# Patient Record
Sex: Female | Born: 1980 | Race: White | Hispanic: No | Marital: Married | Smoking: Never smoker
Health system: Southern US, Community
[De-identification: ages and names within clinical notes are randomized; demographics above are authoritative.]

## PROBLEM LIST (undated history)

## (undated) DIAGNOSIS — T7840XA Allergy, unspecified, initial encounter: Secondary | ICD-10-CM

## (undated) DIAGNOSIS — G8929 Other chronic pain: Secondary | ICD-10-CM

## (undated) DIAGNOSIS — F32A Depression, unspecified: Secondary | ICD-10-CM

## (undated) DIAGNOSIS — R51 Headache: Secondary | ICD-10-CM

## (undated) DIAGNOSIS — F329 Major depressive disorder, single episode, unspecified: Secondary | ICD-10-CM

## (undated) DIAGNOSIS — A09 Infectious gastroenteritis and colitis, unspecified: Secondary | ICD-10-CM

## (undated) DIAGNOSIS — K219 Gastro-esophageal reflux disease without esophagitis: Secondary | ICD-10-CM

## (undated) DIAGNOSIS — F909 Attention-deficit hyperactivity disorder, unspecified type: Secondary | ICD-10-CM

## (undated) DIAGNOSIS — Z8669 Personal history of other diseases of the nervous system and sense organs: Secondary | ICD-10-CM

## (undated) DIAGNOSIS — K859 Acute pancreatitis without necrosis or infection, unspecified: Secondary | ICD-10-CM

## (undated) DIAGNOSIS — R519 Headache, unspecified: Secondary | ICD-10-CM

## (undated) DIAGNOSIS — L509 Urticaria, unspecified: Secondary | ICD-10-CM

## (undated) HISTORY — DX: Headache, unspecified: R51.9

## (undated) HISTORY — DX: Attention-deficit hyperactivity disorder, unspecified type: F90.9

## (undated) HISTORY — DX: Headache: R51

## (undated) HISTORY — PX: UMBILICAL HERNIA REPAIR: SHX196

## (undated) HISTORY — DX: Urticaria, unspecified: L50.9

## (undated) HISTORY — PX: ADENOIDECTOMY: SUR15

## (undated) HISTORY — DX: Acute pancreatitis without necrosis or infection, unspecified: K85.90

## (undated) HISTORY — DX: Other chronic pain: G89.29

## (undated) HISTORY — DX: Allergy, unspecified, initial encounter: T78.40XA

## (undated) HISTORY — DX: Gastro-esophageal reflux disease without esophagitis: K21.9

## (undated) HISTORY — PX: TONSILLECTOMY AND ADENOIDECTOMY: SUR1326

## (undated) HISTORY — DX: Major depressive disorder, single episode, unspecified: F32.9

## (undated) HISTORY — DX: Infectious gastroenteritis and colitis, unspecified: A09

## (undated) HISTORY — PX: UPPER GASTROINTESTINAL ENDOSCOPY: SHX188

## (undated) HISTORY — DX: Depression, unspecified: F32.A

## (undated) HISTORY — DX: Personal history of other diseases of the nervous system and sense organs: Z86.69

## (undated) HISTORY — PX: TUBAL LIGATION: SHX77

---
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
## 2002-11-01 DIAGNOSIS — K859 Acute pancreatitis without necrosis or infection, unspecified: Secondary | ICD-10-CM

## 2002-11-01 HISTORY — DX: Acute pancreatitis without necrosis or infection, unspecified: K85.90

## 2004-09-09 ENCOUNTER — Ambulatory Visit: Payer: Self-pay | Admitting: Oncology

## 2009-07-31 ENCOUNTER — Encounter (INDEPENDENT_AMBULATORY_CARE_PROVIDER_SITE_OTHER): Payer: Self-pay | Admitting: Orthopedic Surgery

## 2009-07-31 ENCOUNTER — Ambulatory Visit (HOSPITAL_BASED_OUTPATIENT_CLINIC_OR_DEPARTMENT_OTHER): Admission: RE | Admit: 2009-07-31 | Discharge: 2009-07-31 | Payer: Self-pay | Admitting: Orthopedic Surgery

## 2011-02-05 LAB — POCT HEMOGLOBIN-HEMACUE: Hemoglobin: 13.9 g/dL (ref 12.0–15.0)
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
## 2013-11-06 ENCOUNTER — Ambulatory Visit (INDEPENDENT_AMBULATORY_CARE_PROVIDER_SITE_OTHER): Payer: No Typology Code available for payment source | Admitting: Family Medicine

## 2013-11-06 VITALS — BP 124/80 | HR 80 | Temp 99.0°F | Resp 17 | Ht 61.0 in | Wt 192.0 lb

## 2013-11-06 DIAGNOSIS — H539 Unspecified visual disturbance: Secondary | ICD-10-CM

## 2013-11-06 DIAGNOSIS — G43909 Migraine, unspecified, not intractable, without status migrainosus: Secondary | ICD-10-CM

## 2013-11-06 DIAGNOSIS — R112 Nausea with vomiting, unspecified: Secondary | ICD-10-CM

## 2013-11-06 MED ORDER — ONDANSETRON 4 MG PO TBDP: 4.0000 mg | ORAL_TABLET | Freq: Three times a day (TID) | ORAL | Status: DC | PRN

## 2013-11-06 MED ORDER — ELETRIPTAN HYDROBROMIDE 20 MG PO TABS: 20.0000 mg | ORAL_TABLET | ORAL | Status: DC | PRN
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
## 2013-11-06 NOTE — Patient Instructions (Signed)
Migraine Headache A migraine headache is an intense, throbbing pain on one or both sides of your head. A migraine can last for 30 minutes to several hours. CAUSES  The exact cause of a migraine headache is not always known. However, a migraine may be caused when nerves in the brain become irritated and release chemicals that cause inflammation. This causes pain. SYMPTOMS  Pain on one or both sides of your head.  Pulsating or throbbing pain.  Severe pain that prevents daily activities.  Pain that is aggravated by any physical activity.  Nausea, vomiting, or both.  Dizziness.  Pain with exposure to bright lights, loud noises, or activity.  General sensitivity to bright lights, loud noises, or smells. Before you get a migraine, you may get warning signs that a migraine is coming (aura). An aura may include:  Seeing flashing lights.  Seeing bright spots, halos, or zig-zag lines.  Having tunnel vision or blurred vision.  Having feelings of numbness or tingling.  Having trouble talking.  Having muscle weakness. MIGRAINE TRIGGERS  Alcohol.  Smoking.  Stress.  Menstruation.  Aged cheeses.  Foods or drinks that contain nitrates, glutamate, aspartame, or tyramine.  Lack of sleep.  Chocolate.  Caffeine.  Hunger.  Physical exertion.  Fatigue.  Medicines used to treat chest pain (nitroglycerine), birth control pills, estrogen, and some blood pressure medicines. DIAGNOSIS  A migraine headache is often diagnosed based on:  Symptoms.  Physical examination.  A CT scan or MRI of your head. TREATMENT Medicines may be given for pain and nausea. Medicines can also be given to help prevent recurrent migraines.  HOME CARE INSTRUCTIONS  Only take over-the-counter or prescription medicines for pain or discomfort as directed by your caregiver. The use of long-term narcotics is not recommended.  Lie down in a dark, quiet room when you have a migraine.  Keep a journal  to find out what may trigger your migraine headaches. For example, write down:  What you eat and drink.  How much sleep you get.  Any change to your diet or medicines.  Limit alcohol consumption.  Quit smoking if you smoke.  Get 7 to 9 hours of sleep, or as recommended by your caregiver.  Limit stress.  Keep lights dim if bright lights bother you and make your migraines worse. SEEK IMMEDIATE MEDICAL CARE IF:   Your migraine becomes severe.  You have a fever.  You have a stiff neck.  You have vision loss.  You have muscular weakness or loss of muscle control.  You start losing your balance or have trouble walking.  You feel faint or pass out.  You have severe symptoms that are different from your first symptoms. MAKE SURE YOU:   Understand these instructions.  Will watch your condition.  Will get help right away if you are not doing well or get worse. Document Released: 10/18/2005 Document Revised: 01/10/2012 Document Reviewed: 10/08/2011 Exit[employer] Patient Information 2014 [employer].  

## 2013-11-07 ENCOUNTER — Telehealth: Payer: Self-pay | Admitting: Radiology

## 2013-11-07 MED ORDER — SUMATRIPTAN SUCCINATE 50 MG PO TABS: 50.0000 mg | ORAL_TABLET | ORAL | Status: DC | PRN

## 2013-11-07 NOTE — Telephone Encounter (Signed)
Spoke with pt advised another Rx was sent in. Pt understood.

## 2013-11-07 NOTE — Telephone Encounter (Signed)
Patient has called the relpax is very expensive would [facility] something [facility] [place]

## 2013-11-07 NOTE — Telephone Encounter (Signed)
Sent sumatriptan (Imitrex) to pharmacy, hopefully this is less expensive

## 2017-03-08 ENCOUNTER — Encounter (HOSPITAL_COMMUNITY): Payer: Self-pay | Admitting: Emergency Medicine

## 2017-03-08 ENCOUNTER — Emergency Department (HOSPITAL_COMMUNITY)
Admission: EM | Admit: 2017-03-08 | Discharge: 2017-03-09 | Disposition: A | Discharge status: Home / Self Care | Payer: BLUE CROSS/BLUE SHIELD | Attending: Emergency Medicine | Admitting: Emergency Medicine

## 2017-03-08 DIAGNOSIS — T7840XA Allergy, unspecified, initial encounter: Secondary | ICD-10-CM | POA: Diagnosis not present

## 2017-03-08 DIAGNOSIS — L509 Urticaria, unspecified: Secondary | ICD-10-CM

## 2017-03-08 DIAGNOSIS — F909 Attention-deficit hyperactivity disorder, unspecified type: Secondary | ICD-10-CM | POA: Insufficient documentation

## 2017-03-08 NOTE — ED Provider Notes (Signed)
[facility]-EMERGENCY DEPT Provider Note   CSN: [id number] Arrival date & time: 03/08/17  2229     History   Chief Complaint Chief Complaint  Patient presents with  . Urticaria    HPI Alexa Horn is a 36 y.o. female.  HPI  36 y.o. female, presents to the Emergency Department today complaining of generalized itchy skin/hives with onset yesterday. Noted moderate relief of itching with OTC Benadryl. Noted rash with hives that are slowly resolving, but itching remains. No airway compromise. No shortness of breath. Notes no pain. States that she is unsure of allergen. No new medications. No new detergents, soaps. No seasonal allergies. No fevers. No other symptoms noted.   Past Medical History:  Diagnosis Date  . ADHD (attention deficit hyperactivity disorder)   . History of migraine headaches   . Pancreatitis    dx when she was pregnant, she gets flareups when she eats tomato and decongestant, was seen by Dr [place]ales [place]Gupta in [place]    There are no active problems to display for this patient.   Past Surgical History:  Procedure Laterality Date  . HERNIA REPAIR    . TONSILLECTOMY AND ADENOIDECTOMY    . TUBAL LIGATION    . UPPER GASTROINTESTINAL ENDOSCOPY      OB History    No data available       Home Medications    Prior to Admission medications   Medication Sig Start Date End Date Taking? Authorizing Provider  ondansetron (ZOFRAN ODT) 4 MG disintegrating tablet Take 1 tablet (4 mg total) by mouth every 8 (eight) hours as needed for nausea or vomiting. 11/06/13   Le, Thao P, DO  SUMAtriptan (IMITREX) 50 MG tablet Take 1 tablet (50 mg total) by mouth every 2 (two) hours as needed for migraine or headache. May repeat in 2 hours.  Max dose 200mg /day 11/07/13   Godfrey PickEgan, Eleanore E, PA-C    Family History No family history on file.  Social History Social History  Substance Use Topics  . Smoking status: Never Smoker  . Smokeless tobacco: Never Used  . Alcohol use No     Allergies     Ceftin [cefuroxime axetil]   Review of Systems Review of Systems  Constitutional: Negative for fever.  HENT: Negative for sinus pressure and trouble swallowing.   Respiratory: Negative for cough and shortness of breath.   Cardiovascular: Negative for chest pain.  Gastrointestinal: Negative for nausea.   Physical Exam Updated Vital Signs BP (!) 143/96 (BP Location: Left Arm)   Pulse 92   Temp 98.3 F (36.8 C) (Oral)   Resp 16   Ht 5' 1.5" (1.562 m)   Wt 94.3 kg   LMP 02/25/2017 (Approximate)   SpO2 98%   BMI 38.66 kg/m   Physical Exam  Constitutional: She is oriented to person, place, and time. Vital signs are normal. She appears well-developed and well-nourished.  HENT:  Head: Normocephalic and atraumatic.  Right Ear: Hearing normal.  Left Ear: Hearing normal.  Pt phonating well. No indication of airway compromise  Eyes: Conjunctivae and EOM are normal. Pupils are equal, round, and reactive to light.  Neck: Normal range of motion. Neck supple.  Cardiovascular: Normal rate, regular rhythm, normal heart sounds and intact distal pulses.   Pulmonary/Chest: Effort normal.  Abdominal: There is no tenderness.  Neurological: She is alert and oriented to person, place, and time.  Skin: Skin is warm and dry.  Diffuse urticarial rash with hives noted BUE/BLE as well as trunk.  No signs of infection.   Psychiatric: She has a normal mood and affect. Her speech is normal and behavior is normal. Thought content normal.  Nursing note and vitals reviewed.  ED Treatments / Results  Labs (all labs ordered are listed, but only abnormal results are displayed) Labs Reviewed - No data to display  EKG  EKG Interpretation None       Radiology No results found.  Procedures Procedures (including critical care time)  Medications Ordered in ED Medications - No data to display   Initial Impression / Assessment and Plan / ED Course  I have reviewed the triage vital signs and the  nursing notes.  Pertinent labs & imaging results that were available during my care of the patient were reviewed by me and considered in my medical decision making (see chart for details).  Final Clinical Impressions(s) / ED Diagnoses     {I have reviewed the relevant previous healthcare records.  {I obtained HPI from historian.   ED Course:  Assessment: Pt is a 36 y.o. female who presents with urticarial reaction. Unsure of source. No signs of airway compromise. Symptoms began yesterday. On exam, pt in NAD. Nontoxic/nonseptic appearing. VSS. Afebrile. Lungs CTA. Heart RRR. Abdomen nontender soft. Phonating well. NO signs of airway compromise. Given benadryl and steroids in ED. Plan is to DC home with steroids and follow up to PCP. At time of discharge, Patient is in no acute distress. Vital Signs are stable. Patient is able to ambulate. Patient able to tolerate PO.   Disposition/Plan:  DC Home Additional Verbal discharge instructions given and discussed with patient.  Pt Instructed to f/u with PCP in the next week for evaluation and treatment of symptoms. Return precautions given Pt acknowledges and agrees with plan  Supervising Physician Horton, Mayer Masker, MD  Final diagnoses:  Allergic reaction, initial encounter  Urticarial rash    New Prescriptions New Prescriptions   No medications on file     Audry Pili, Cordelia Poche 03/09/17 0040    Shon Baton, MD 03/09/17 [phone]

## 2017-03-08 NOTE — ED Triage Notes (Signed)
Patient reports generalized itchy skin hives/rashes onset yesterday unrelieved by OTC Benadryl , no oral swelling / respirations unlabored .

## 2017-03-09 MED ORDER — EPINEPHRINE 0.3 MG/0.3ML IJ SOAJ: 0.3000 mg | Freq: Once | INTRAMUSCULAR | 0 refills | Status: AC

## 2017-03-09 MED ORDER — PREDNISONE 20 MG PO TABS: 60.0000 mg | ORAL_TABLET | Freq: Every day | ORAL | 0 refills | Status: DC

## 2017-03-09 MED ORDER — DIPHENHYDRAMINE HCL 25 MG PO CAPS
25.0000 mg | ORAL_CAPSULE | Freq: Once | ORAL | Status: AC
  Administered 2017-03-09: 25 mg via ORAL
  Filled 2017-03-09: qty 1

## 2017-03-09 MED ORDER — DIPHENHYDRAMINE HCL 25 MG PO CAPS: 25.0000 mg | ORAL_CAPSULE | Freq: Four times a day (QID) | ORAL | 0 refills | Status: AC | PRN

## 2017-03-09 MED ORDER — FAMOTIDINE 20 MG PO TABS
20.0000 mg | ORAL_TABLET | Freq: Once | ORAL | Status: AC
  Administered 2017-03-09: 20 mg via ORAL
  Filled 2017-03-09: qty 1

## 2017-03-09 MED ORDER — PREDNISONE 20 MG PO TABS
60.0000 mg | ORAL_TABLET | Freq: Once | ORAL | Status: AC
  Administered 2017-03-09: 60 mg via ORAL
  Filled 2017-03-09: qty 3

## 2017-03-09 NOTE — Discharge Instructions (Signed)
Please read and follow all provided instructions.  Your diagnoses today include:  1. Allergic reaction, initial encounter   2. Urticarial rash     Tests performed today include: Vital signs. See below for your results today.   Medications prescribed:  Take as prescribed   Home care instructions:  Follow any educational materials contained in this packet.  Follow-up instructions: Please follow-up with an allergist for further evaluation of symptoms and treatment   Return instructions:  Please return to the Emergency Department if you do not get better, if you get worse, or new symptoms OR  - Fever (temperature greater than 101.51F)  - Bleeding that does not stop with holding pressure to the area    -Severe pain (please note that you may be more sore the day after your accident)  - Chest Pain  - Difficulty breathing  - Severe nausea or vomiting  - Inability to tolerate food and liquids  - Passing out  - Skin becoming red around your wounds  - Change in mental status (confusion or lethargy)  - New numbness or weakness    Please return if you have any other emergent concerns.  Additional Information:  Your vital signs today were: BP (!) 143/96 (BP Location: Left Arm)    Pulse 92    Temp 98.3 F (36.8 C) (Oral)    Resp 16    Ht 5' 1.5" (1.562 m)    Wt 94.3 kg    LMP 02/25/2017 (Approximate)    SpO2 98%    BMI 38.66 kg/m  If your blood pressure (BP) was elevated above 135/85 this visit, please have this repeated by your doctor within one month. ---------------

## 2017-03-11 ENCOUNTER — Emergency Department (HOSPITAL_COMMUNITY): Payer: BLUE CROSS/BLUE SHIELD

## 2017-03-11 ENCOUNTER — Encounter (HOSPITAL_COMMUNITY): Payer: Self-pay | Admitting: *Deleted

## 2017-03-11 ENCOUNTER — Emergency Department (HOSPITAL_COMMUNITY)
Admission: EM | Admit: 2017-03-11 | Discharge: 2017-03-12 | Disposition: A | Discharge status: Home / Self Care | Payer: BLUE CROSS/BLUE SHIELD | Attending: Emergency Medicine | Admitting: Emergency Medicine

## 2017-03-11 DIAGNOSIS — Z7982 Long term (current) use of aspirin: Secondary | ICD-10-CM | POA: Diagnosis not present

## 2017-03-11 DIAGNOSIS — L509 Urticaria, unspecified: Secondary | ICD-10-CM | POA: Diagnosis not present

## 2017-03-11 DIAGNOSIS — F909 Attention-deficit hyperactivity disorder, unspecified type: Secondary | ICD-10-CM | POA: Insufficient documentation

## 2017-03-11 DIAGNOSIS — R519 Headache, unspecified: Secondary | ICD-10-CM

## 2017-03-11 DIAGNOSIS — L299 Pruritus, unspecified: Secondary | ICD-10-CM | POA: Diagnosis present

## 2017-03-11 DIAGNOSIS — H5500 Unspecified nystagmus: Secondary | ICD-10-CM | POA: Diagnosis not present

## 2017-03-11 DIAGNOSIS — R51 Headache: Secondary | ICD-10-CM | POA: Diagnosis not present

## 2017-03-11 DIAGNOSIS — R42 Dizziness and giddiness: Secondary | ICD-10-CM | POA: Diagnosis not present

## 2017-03-11 LAB — COMPREHENSIVE METABOLIC PANEL
ALT: 13 U/L — ABNORMAL LOW (ref 14–54)
AST: 19 U/L (ref 15–41)
Albumin: 3.9 g/dL (ref 3.5–5.0)
Alkaline Phosphatase: 47 U/L (ref 38–126)
Anion gap: 9 (ref 5–15)
BUN: 10 mg/dL (ref 6–20)
CO2: 21 mmol/L — ABNORMAL LOW (ref 22–32)
Calcium: 8.1 mg/dL — ABNORMAL LOW (ref 8.9–10.3)
Chloride: 108 mmol/L (ref 101–111)
Creatinine, Ser: 0.83 mg/dL (ref 0.44–1.00)
GFR calc Af Amer: >60 mL/min (ref >60)
GFR calc non Af Amer: >60 mL/min (ref >60)
Glucose, Bld: 135 mg/dL — ABNORMAL HIGH (ref 65–99)
Potassium: 3.9 mmol/L (ref 3.5–5.1)
Sodium: 138 mmol/L (ref 135–145)
Total Bilirubin: 0.3 mg/dL (ref 0.3–1.2)
Total Protein: 6.9 g/dL (ref 6.5–8.1)

## 2017-03-11 LAB — CBC WITH DIFFERENTIAL/PLATELET
Basophils Absolute: 0 10*3/uL (ref 0.0–0.1)
Basophils Relative: 0 %
Eosinophils Absolute: 0 10*3/uL (ref 0.0–0.7)
Eosinophils Relative: 0 %
HCT: 37.9 % (ref 36.0–46.0)
Hemoglobin: 12.7 g/dL (ref 12.0–15.0)
Lymphocytes Relative: 12 %
Lymphs Abs: 1.4 10*3/uL (ref 0.7–4.0)
MCH: 30.2 pg (ref 26.0–34.0)
MCHC: 33.5 g/dL (ref 30.0–36.0)
MCV: 90 fL (ref 78.0–100.0)
Monocytes Absolute: 0.1 10*3/uL (ref 0.1–1.0)
Monocytes Relative: 1 %
Neutro Abs: 10 10*3/uL — ABNORMAL HIGH (ref 1.7–7.7)
Neutrophils Relative %: 87 %
Platelets: 251 10*3/uL (ref 150–400)
RBC: 4.21 MIL/uL (ref 3.87–5.11)
RDW: 12.8 % (ref 11.5–15.5)
WBC: 11.5 10*3/uL — ABNORMAL HIGH (ref 4.0–10.5)

## 2017-03-11 LAB — URINALYSIS, ROUTINE W REFLEX MICROSCOPIC
Bilirubin Urine: NEGATIVE
Glucose, UA: NEGATIVE mg/dL
Hgb urine dipstick: NEGATIVE
Ketones, ur: NEGATIVE mg/dL
Nitrite: NEGATIVE
Protein, ur: NEGATIVE mg/dL
Specific Gravity, Urine: 1.012 (ref 1.005–1.030)
pH: 5 (ref 5.0–8.0)

## 2017-03-11 LAB — MAGNESIUM: Magnesium: 1.7 mg/dL (ref 1.7–2.4)

## 2017-03-11 LAB — PREGNANCY, URINE: Preg Test, Ur: NEGATIVE

## 2017-03-11 MED ORDER — FAMOTIDINE IN NACL 20-0.9 MG/50ML-% IV SOLN
20.0000 mg | Freq: Once | INTRAVENOUS | Status: AC
  Administered 2017-03-11: 20 mg via INTRAVENOUS
  Filled 2017-03-11: qty 50

## 2017-03-11 MED ORDER — ONDANSETRON HCL 4 MG/2ML IJ SOLN
4.0000 mg | Freq: Once | INTRAMUSCULAR | Status: AC
  Administered 2017-03-11: 4 mg via INTRAVENOUS
  Filled 2017-03-11: qty 2

## 2017-03-11 MED ORDER — DIPHENHYDRAMINE HCL 50 MG/ML IJ SOLN
25.0000 mg | Freq: Once | INTRAMUSCULAR | Status: AC
  Administered 2017-03-11: 25 mg via INTRAVENOUS
  Filled 2017-03-11: qty 1

## 2017-03-11 MED ORDER — SODIUM CHLORIDE 0.9 % IV BOLUS (SEPSIS)
1000.0000 mL | Freq: Once | INTRAVENOUS | Status: AC
  Administered 2017-03-11: 1000 mL via INTRAVENOUS

## 2017-03-11 MED ORDER — KETOROLAC TROMETHAMINE 30 MG/ML IJ SOLN
30.0000 mg | Freq: Once | INTRAMUSCULAR | Status: AC
  Administered 2017-03-11: 30 mg via INTRAVENOUS
  Filled 2017-03-11: qty 1

## 2017-03-11 NOTE — ED Notes (Signed)
Neurologist at bedside. 

## 2017-03-11 NOTE — Consult Note (Signed)
Neurology Consultation Reason for Consult: Dizziness Referring Physician: Tegeler, C  CC: Urticaria  History is obtained from: Patient  HPI: Alexa Horn is a 36 y.o. female who has been having recurrent episodes of urticaria past couple of weeks. She had a flare today and went to urgent care where she was given at the, steroids and fluids. She was there around 20 minutes. She then a couple of hours later felt her symptoms return and went to fire station where she was transported to the ER via EMS.  She notes dizziness that has been present with this episode. She also describes spots in her vision which have come and gone. She has photophobia and mild nausea. She has some discomfort on the right side of her head.  Of note, she does get vertiginous migraine as does her mother. She also notes that her eyes feel funny, and she has noticed this for a couple of months.    ROS: A 14 point ROS was performed and is negative except as noted in the HPI.   Past Medical History:  Diagnosis Date  . ADHD (attention deficit hyperactivity disorder)   . History of migraine headaches   . Pancreatitis    dx when she was pregnant, she gets flareups when she eats tomato and decongestant, was seen by Dr Chales AbrahamsGupta in [place]     Mother-vertiginous migraine  Social History:  reports that she has never smoked. She has never used smokeless tobacco. She reports that she does not drink alcohol or use drugs.   Exam: Current vital signs: BP (!) 144/95 (BP Location: Left Arm)   Pulse 87   Temp 97.8 F (36.6 C)   Resp 20   LMP 02/25/2017 (Approximate)   SpO2 100%  Vital signs in last 24 hours: Temp:  [97.8 F (36.6 C)] 97.8 F (36.6 C) (05/11 1552) Pulse Rate:  [81-108] 87 (05/11 2106) Resp:  [20-22] 20 (05/11 2106) BP: (130-144)/(72-95) 144/95 (05/11 2106) SpO2:  [98 %-100 %] 100 % (05/11 2106)   Physical Exam  Constitutional: Appears well-developed and well-nourished.  Psych: Affect appropriate  to situation Eyes: No scleral injection HENT: No OP obstrucion Head: Normocephalic.  Cardiovascular: Normal rate and regular rhythm.  Respiratory: Effort normal and breath sounds normal to anterior ascultation GI: Soft.  No distension. There is no tenderness.  Skin: WDI  Neuro: Mental Status: Patient is awake, alert, oriented to person, place, month, year, and situation. Patient is able to give a clear and coherent history. No signs of aphasia or neglect Cranial Nerves: II: Visual Fields are full. Pupils are equal, round, and reactive to light.   III,IV, VI: EOMI without ptosis or diploplia. She has abnormal saccadic intrusion into smooth pursuit, though when testing finger-nose-finger I move my finger repeatedly and she follows without clear saccadic intrusion. V: Facial sensation is symmetric to temperature VII: Facial movement is symmetric.  VIII: hearing is intact to voice X: Uvula elevates symmetrically XI: Shoulder shrug is symmetric. XII: tongue is midline without atrophy or fasciculations.  Motor: Tone is normal. Bulk is normal. 5/5 strength was present in all four extremities.  Sensory: Sensation is symmetric to light touch and temperature in the arms and legs. Deep Tendon Reflexes: 2+ and symmetric in the biceps and patellae.  Plantars: Toes are downgoing bilaterally.  Cerebellar: FNF and HKS are intact bilaterally      I have reviewed labs in epic and the results pertinent to this consultation are: CMP-unremarkable  I have reviewed the  images obtained: CT head-unremarkable  Impression: 36 year old female with dizziness, lightheadedness, photophobia, head discomfort in the setting of previous migraines with similar symptoms. I suspect that due to physiological stress with whatever is happening with the recurrent urticaria, she has developed a competent migraine.  Given these other symptoms, however, I do think that MRI is reasonable and would favor getting this  prior to discharge. If this is negative, then neurology would have no further recommendations other than consideration of Toradol for migraine.  Recommendations: 1) MRI brain 2) if negative could consider Toradol for migraine    Ritta Slot, MD [facility] [phone]  If 7pm- 7am, please page neurology on call as listed in [facility].

## 2017-03-11 NOTE — ED Notes (Signed)
Pt transported to MRI 

## 2017-03-11 NOTE — ED Provider Notes (Signed)
[facility]-EMERGENCY DEPT Provider Note   CSN: [id number] Arrival date & time: 03/11/17  1531     History   Chief Complaint Chief Complaint  Patient presents with  . Allergic Reaction    HPI Alexa Horn is a 36 y.o. female.  The history is provided by the patient and medical records. No language interpreter was used.     Alexa Horn is a 36 y.o. female  with a PMH of migraines, ADHD who presents to the Emergency Department complaining of "allergic reaction" which occurred today.   Patient states that Tuesday night (5/08) she developed generalized itching with redness to her bilateral arms, neck, back and cheeks. She went to ER where she was given a few medications (benadryl, pepcid, prednisone) and felt better. No known trigger. She was taking her home prednisone daily, but forgot to take it this morning. Today, she was eating lunch when she felt her lips start to tingle and face felt flushed. Friend she was eating lunch with told her that her face was very red. She then felt as if she was having difficulty swallowing. She took a 25mg  benadryl, then went to the nearest urgent care where she states she was given epi, a steroid shot and a bag of fluids. She was there around 20 minutes, felt better and was discharged to home. Left urgent care around 12:45. Around 2 - 2:30 she felt like her symptoms started coming back. She again felt facial flushing and as if it was more difficult to swallow. She drove to the closest place she could think of which was the fire station. EMS then transported patient to ER. She has had no medication since seen at urgent care. Has not had any fevers/chills or recent illnesses. No shortness of breath or chest pain. No wheezing. No similar foods, lotions, detergents or other known triggers similar to episode on 5/08. She additionally notes intermittent headaches and her eyes "dancing around like they can't focus" off and on for 2-3 months. Denies blurry vision, numbness or  tingling.  Past Medical History:  Diagnosis Date  . ADHD (attention deficit hyperactivity disorder)   . History of migraine headaches   . Pancreatitis    dx when she was pregnant, she gets flareups when she eats tomato and decongestant, was seen by Dr Chales Abrahams in [place]    There are no active problems to display for this patient.   Past Surgical History:  Procedure Laterality Date  . HERNIA REPAIR    . TONSILLECTOMY AND ADENOIDECTOMY    . TUBAL LIGATION    . UPPER GASTROINTESTINAL ENDOSCOPY      OB History    No data available       Home Medications    Prior to Admission medications   Medication Sig Start Date End Date Taking? Authorizing Provider  aspirin-acetaminophen-caffeine (EXCEDRIN MIGRAINE) [phone] MG tablet Take 2 tablets by mouth every 6 (six) hours as needed for headache.   Yes [provider]  CONTRAVE 8-90 MG TB12 Take 2 tablets by mouth 2 (two) times daily.  02/11/17  Yes [provider]  diphenhydrAMINE (BENADRYL) 25 mg capsule Take 1 capsule (25 mg total) by mouth every 6 (six) hours as needed. 03/09/17  Yes Audry Pili, PA-C  predniSONE (DELTASONE) 20 MG tablet Take 2 tablets (40 mg total) by mouth total until Wednesday 5/16. 03/12/17   Ward, Chase Picket, PA-C  SUMAtriptan (IMITREX) 50 MG tablet Take 1 tablet (50 mg total) by mouth every 2 (two)  hours as needed for migraine or headache. May repeat in 2 hours.  Max dose 200mg /day Patient not taking: Reported on 03/11/2017 11/07/13   Godfrey Pick, PA-C    Family History No family history on file.  Social History Social History  Substance Use Topics  . Smoking status: Never Smoker  . Smokeless tobacco: Never Used  . Alcohol use No     Allergies   Ceftin [cefuroxime axetil]; Lactose intolerance (gi); and Orange fruit [citrus]   Review of Systems Review of Systems  HENT: Positive for trouble swallowing.   Skin: Positive for color change.  Neurological:       + tingling  All  other systems reviewed and are negative.    Physical Exam Updated Vital Signs BP 117/82 (BP Location: Left Arm)   Pulse 83   Temp 97.8 F (36.6 C)   Resp 18   LMP 02/25/2017 (Approximate)   SpO2 98%   Physical Exam  Constitutional: She is oriented to person, place, and time. She appears well-developed and well-nourished. No distress.  HENT:  Head: Normocephalic and atraumatic.  Airway patent. No oral or lip swelling.  Eyes:  Bilateral nystagmus with accommodation.  Cardiovascular: Normal rate, regular rhythm and normal heart sounds.   No murmur heard. Pulmonary/Chest: Effort normal and breath sounds normal. No respiratory distress.  Lungs clear to ausculation bilaterally. No wheezing. Speaking in full sentences without difficulty. No hoarseness.  Abdominal: Soft. She exhibits no distension. There is no tenderness.  Musculoskeletal: Normal range of motion.  Neurological: She is alert and oriented to person, place, and time.  Skin: Skin is warm and dry.  No rash noted on exam, however patient does have photos from earlier today prior to medications showing redness to checks, chin and chest that seem c/w urticaria.  Nursing note and vitals reviewed.    ED Treatments / Results  Labs (all labs ordered are listed, but only abnormal results are displayed) Labs Reviewed  COMPREHENSIVE METABOLIC PANEL - Abnormal; Notable for the following:       Result Value   CO2 21 (*)    Glucose, Bld 135 (*)    Calcium 8.1 (*)    ALT 13 (*)    All other components within normal limits  CBC WITH DIFFERENTIAL/PLATELET - Abnormal; Notable for the following:    WBC 11.5 (*)    Neutro Abs 10.0 (*)    All other components within normal limits  URINALYSIS, ROUTINE W REFLEX MICROSCOPIC - Abnormal; Notable for the following:    APPearance HAZY (*)    Leukocytes, UA SMALL (*)    Bacteria, UA RARE (*)    Squamous Epithelial / LPF 6-30 (*)    All other components within normal limits  PREGNANCY,  URINE  MAGNESIUM    EKG  EKG Interpretation None       Radiology Ct Head Wo Contrast  Result Date: 03/11/2017 CLINICAL DATA:  Headache.  Allergic reaction. EXAM: CT HEAD WITHOUT CONTRAST TECHNIQUE: Contiguous axial images were obtained from the base of the skull through the vertex without intravenous contrast. COMPARISON:  07/11/2013 FINDINGS: Brain: Normal. No evidence of acute infarction, hemorrhage, hydrocephalus, extra-axial collection or mass lesion/mass effect. Vascular: No hyperdense vessel or unexpected calcification. Skull: Normal. Negative for fracture or focal lesion. Sinuses/Orbits: Negative IMPRESSION: Negative head CT. Electronically Signed   By: Marnee Spring M.D.   On: 03/11/2017 19:55   Mr Brain Wo Contrast  Result Date: 03/11/2017 CLINICAL DATA:  36 y/o F; dizziness, vision  changes, and nausea. History of migraine headache. EXAM: MRI HEAD WITHOUT CONTRAST TECHNIQUE: Multiplanar, multiecho pulse sequences of the brain and surrounding structures were obtained without intravenous contrast. COMPARISON:  03/11/2017 CT of the head. FINDINGS: Brain: No acute infarction, hemorrhage, hydrocephalus, extra-axial collection or mass lesion. Vascular: Normal flow voids. Skull and upper cervical spine: Normal marrow signal. Sinuses/Orbits: Negative. Other: None. IMPRESSION: Normal MRI of the brain. Electronically Signed   By: Mitzi HansenLance  Furusawa-Stratton M.D.   On: 03/11/2017 23:06    Procedures Procedures (including critical care time)  Medications Ordered in ED Medications  famotidine (PEPCID) IVPB 20 mg premix (0 mg Intravenous Stopped 03/11/17 1715)  diphenhydrAMINE (BENADRYL) injection 25 mg (25 mg Intravenous Given 03/11/17 1645)  sodium chloride 0.9 % bolus 1,000 mL (0 mLs Intravenous Stopped 03/11/17 1846)  ondansetron (ZOFRAN) injection 4 mg (4 mg Intravenous Given 03/11/17 1844)  sodium chloride 0.9 % bolus 1,000 mL (0 mLs Intravenous Stopped 03/11/17 2333)  ketorolac (TORADOL)  30 MG/ML injection 30 mg (30 mg Intravenous Given 03/11/17 2333)     Initial Impression / Assessment and Plan / ED Course  I have reviewed the triage vital signs and the nursing notes.  Pertinent labs & imaging results that were available during my care of the patient were reviewed by me and considered in my medical decision making (see chart for details).    Hale Dronennie B Schild is a 36 y.o. female who presents to ED for recurrent urticaria associated with lip tingling and dysphagia thought to be 2/2 allergic reaction. Seen in ED on 5/09 for same: chart reviewed from this encounter. She did not take prednisone this morning. Unsure of trigger. On exam, patient with patent airway, no signs of angioedema or oral swelling and clear lungs bilaterally. Seen by urgent care prior to arrival and given Epi and steroids. Benadryl and pepcid given here.   Patient also endorses intermittent headaches associated with nausea, photophobia and dizziness. Very severe bilateral nystagmus on exam. CT head negative. Neurology consulted who came to ED to evaluation patient. Appreciate their assistance with patient care today. Per neurology, obtain MRI brain. If negative, treat migraine and have patient follow up with neurology.   MRI also negative. Labs and urine reassuring. Patient has been in ED > 8 hours with no return of rash or signs of airway compromise. Repeat evaluation reassuring with clear lungs and speaking in full sentences without difficulty. Patient feels comfortable with discharge to home. Evaluation does not show pathology that would require ongoing emergent intervention or inpatient treatment however I spoke with patient and family AT LENGTH about reasons to return to the ER including difficulty breathing or swallowing, oral/lip swelling, recurrent rash not improving with benadryl, new or worsening symptoms, any other concerning symptoms. Patient voiced understanding and agreement with plan as dictated above and  understands to keep scheduled appointment with allergist on Wednesday. 40mg  prednisone daily until this appointment (5 day burst). Also understands to follow up with neurology - referral information given. All questions answered.   Patient seen by and discussed with Dr. Rush Landmarkegeler who agrees with treatment plan.   Final Clinical Impressions(s) / ED Diagnoses   Final diagnoses:  Urticaria  Nonintractable headache, unspecified chronicity pattern, unspecified headache type  Nystagmus    New Prescriptions Current Discharge Medication List       Ward, Chase PicketJaime Pilcher, PA-C 03/12/17 [id number]    Tegeler, Canary Brimhristopher J, MD 03/12/17 [id number]

## 2017-03-11 NOTE — ED Triage Notes (Signed)
BIB EMS with "allergic reaction" [facility] and today for same allergic reaction.

## 2017-03-11 NOTE — ED Notes (Signed)
Patient transported to CT 

## 2017-03-11 NOTE — ED Notes (Signed)
PT state while standing for vitals felt legs were going to give out

## 2017-03-12 MED ORDER — PREDNISONE 20 MG PO TABS: ORAL_TABLET | ORAL | 0 refills | Status: DC

## 2017-03-12 NOTE — Discharge Instructions (Signed)
It was my pleasure taking care of you today!   Thankfully your workup today was very reassuring with a negative CT and MRI of your head.  I would like you to call the neurology clinic listed on Monday morning to schedule a follow-up appointment for further discussion of today's emergency department visit.  It is very important that you keep your scheduled appointment with the allergist on Wednesday. As we discussed, continue taking prednisone daily until this appointment. Continue taking Benadryl as needed.  Please return to the ER if you develop any difficulty breathing, lip or oral swelling, difficulty swallowing, new or worsening symptoms, any additional concerns.

## 2017-04-01 ENCOUNTER — Ambulatory Visit: Payer: No Typology Code available for payment source | Admitting: Allergy & Immunology

## 2017-04-18 ENCOUNTER — Ambulatory Visit: Payer: No Typology Code available for payment source | Admitting: Allergy and Immunology

## 2017-04-21 ENCOUNTER — Ambulatory Visit (INDEPENDENT_AMBULATORY_CARE_PROVIDER_SITE_OTHER): Payer: BLUE CROSS/BLUE SHIELD | Admitting: Allergy and Immunology

## 2017-04-21 ENCOUNTER — Encounter: Payer: Self-pay | Admitting: Allergy and Immunology

## 2017-04-21 VITALS — BP 110/78 | HR 64 | Temp 98.1°F | Resp 16 | Ht 61.0 in | Wt 206.0 lb

## 2017-04-21 DIAGNOSIS — T7800XD Anaphylactic reaction due to unspecified food, subsequent encounter: Secondary | ICD-10-CM

## 2017-04-21 NOTE — Progress Notes (Signed)
Dear Dr. Mathis BudUppin,  Thank you for referring Alexa DroneAnnie B Bacallao to the [facility] on 04/21/2017.   Below is a summation of this patient's evaluation and recommendations.  Thank you for your referral. I will keep you informed about this patient's response to treatment.   If you have any questions please do not hesitate to contact me.   Sincerely,  Jessica PriestEric J. Kozlow, MD Allergy / Immunology [facility]   ______________________________________________________________________    NEW PATIENT NOTE  Referring Provider: Lucianne LeiUppin, Nina, MD Primary Provider: Lucianne LeiUppin, Nina, MD Date of office visit: 04/21/2017    Subjective:   Chief Complaint:  Alexa Horn (DOB: 11/26/1980) is a 36 y.o. female who presents to the clinic on 04/21/2017 with a chief complaint of Allergic Reaction (Dx with Alpha Gal and other food allergies) .     HPI: Pattricia Horn presents to this clinic in evaluation of allergic reaction that occurred approximately one month ago. Apparently she had a one-week period of recurrent episodes of red raised itchy lesions across her face and body with mouth and chin swelling and difficulty breathing requiring her to go to the urgent care and emergency room on at least 2 occasions and received two systemic steroids as well as other therapy. There was no other associated systemic or constitutional symptoms.  There was not an obvious provoking factor giving rise to this immunological hyperreactivity. Apparently she did see a allergist in [facility] who did blood testing which identified milk and wheat and dog hypersensitivity. She also saw her primary care doctor who performed a blood test for alpha gal which reportedly is positive. Since she stopped all mammal consumption all hyperreactivity has stopped. She did have exposure to a tick on a pretty common basis and in fact had a significant embedded tick about 3 weeks  prior to her reaction. She does have a history of early childhood Orange reaction which apparently precipitates a yeast infection. She will get itchy eyes and nasal congestion and sneezing if she gets exposure to cats. But otherwise really has no other significant atopic disease.  Past Medical History:  Diagnosis Date  . ADHD (attention deficit hyperactivity disorder)   . History of migraine headaches   . Pancreatitis    dx when she was pregnant, she gets flareups when she eats tomato and decongestant, was seen by [place]Gu[place] in [place]  . Urticaria     Past Surgical History:  Procedure Laterality Date  . ADENOIDECTOMY    . HERNIA REPAIR    . TONSILLECTOMY AND ADENOIDECTOMY    . TUBAL LIGATION    . UPPER GASTROINTESTINAL ENDOSCOPY      Allergies as of 04/21/2017      Reactions   Ceftin [cefuroxime Axetil] Hives   Lactose Intolerance (gi)    Orange Fruit [citrus]    Advised caused yeast infections when she was younger      Medication List      aspirin-acetaminophen-caffeine 250-250-65 MG tablet Commonly known as:  EXCEDRIN MIGRAINE Take 2 tablets by mouth every 6 (six) hours as needed for headache.   CONTRAVE 8-90 MG Tb12 Generic drug:  Naltrexone-Bupropion HCl ER Take 2 tablets by mouth 2 (two) times daily.   diphenhydrAMINE 25 mg capsule Commonly known as:  BENADRYL Take 1 capsule (25 mg total) by mouth every 6 (six) hours as needed.   EPIPEN 2-PAK 0.3 mg/0.3 mL Soaj injection Generic drug:  EPINEPHrine Inject 0.3 mg into the skin as needed for anaphylaxis.   fluticasone 50 MCG/ACT nasal spray Commonly known as:  FLONASE Place 1 spray into both nostrils daily.   SUMAtriptan 50 MG tablet Commonly known as:  IMITREX Take 1 tablet (50 mg total) by mouth every 2 (two) hours as needed for migraine or headache. May repeat in 2 hours.  Max dose 200mg /day       Review of systems negative except as noted in HPI / PMHx or noted below:  Review of Systems    Constitutional: Negative.   HENT: Negative.   Eyes: Negative.   Respiratory: Negative.   Cardiovascular: Negative.   Gastrointestinal: Negative.   Genitourinary: Negative.   Musculoskeletal: Negative.   Skin: Negative.   Neurological: Negative.   Endo/Heme/Allergies: Negative.   Psychiatric/Behavioral: Negative.     Family History  Problem Relation Age of Onset  . Breast cancer Maternal Aunt   . Breast cancer Maternal Grandmother   . Heart disease Maternal Grandfather   . Hypercholesterolemia Maternal Grandfather   . Hypertension Maternal Grandfather     Social History   Social History  . Marital status: Married    Spouse name: N/A  . Number of children: N/A  . Years of education: N/A   Occupational History  . Not on file.   Social History Main Topics  . Smoking status: Never Smoker  . Smokeless tobacco: Never Used  . Alcohol use No  . Drug use: No  . Sexual activity: Not on file   Other Topics Concern  . Not on file   Social History Narrative  . No narrative on file    Environmental and Social history  Lives in a mobile home with a dry environment, a dog located inside the household, no carpeting in the bedroom, no plastic on the bed, no plastic on the pillow, no smoking ongoing with inside the household, and employment as a Environmental manager.  Objective:   Vitals:   04/21/17 0905  BP: 110/78  Pulse: 64  Resp: 16  Temp: 98.1 F (36.7 C)   Height: 5\' 1"  (154.9 cm) Weight: 206 lb (93.4 kg)  Physical Exam  Constitutional: She is well-developed, well-nourished, and in no distress.  HENT:  Head: Normocephalic. Head is without right periorbital erythema and without left periorbital erythema.  Right Ear: Tympanic membrane, external ear and ear canal normal.  Left Ear: Tympanic membrane, external ear and ear canal normal.  Nose: Nose normal. No mucosal edema or rhinorrhea.  Mouth/Throat: Uvula is midline, oropharynx is clear and moist and mucous membranes  are normal. No oropharyngeal exudate.  Eyes: Conjunctivae and lids are normal. Pupils are equal, round, and reactive to light.  Neck: Trachea normal. No tracheal tenderness present. No tracheal deviation present. No thyromegaly present.  Cardiovascular: Normal rate, regular rhythm, S1 normal, S2 normal and normal heart sounds.   No murmur heard. Pulmonary/Chest: Effort normal and breath sounds normal. No stridor. No tachypnea. No respiratory distress. She has no wheezes. She has no rales. She exhibits no tenderness.  Abdominal: Soft. She exhibits no distension and no mass. There is no hepatosplenomegaly. There is no tenderness. There is no rebound and no guarding.  Musculoskeletal: She exhibits no edema or tenderness.  Lymphadenopathy:       Head (right side): No tonsillar adenopathy present.       Head (left side): No tonsillar adenopathy present.    She has no cervical adenopathy.    She has no axillary adenopathy.  Neurological: She is alert. Gait normal.  Skin: No rash noted. She is not diaphoretic. No erythema. No pallor. Nails show no clubbing.  Psychiatric: Mood and affect normal.    Diagnostics: Allergy skin tests were not performed.   Assessment and Plan:    1. Anaphylactic shock due to food, subsequent encounter     1. Avoidance measures regarding all mammal consumption  2. EpiPen, Benadryl, M.D./ER evaluation for allergic reaction  3. Further evaluation? Yes, if recurrent reactions  4. Review results of alpha gal testing  At this point in time I will assume that Miyako does have alpha gal syndrome and have her remain away from all mammal consumption. Certainly if she has recurrent reactions in the face of this approach she will require further evaluation and treatment. She will keep in contact with me regarding her condition as she moves forward.  Jessica Priest, MD [facility]

## 2017-04-21 NOTE — Patient Instructions (Addendum)
  1. Avoidance measures regarding all mammal consumption  2. EpiPen, Benadryl, M.D./ER evaluation for allergic reaction  3. Further evaluation? Yes, if recurrent reactions  4. Review results of alpha gal testing

## 2017-04-25 ENCOUNTER — Ambulatory Visit: Payer: No Typology Code available for payment source | Admitting: Allergy and Immunology

## 2017-05-12 ENCOUNTER — Ambulatory Visit (INDEPENDENT_AMBULATORY_CARE_PROVIDER_SITE_OTHER): Payer: BLUE CROSS/BLUE SHIELD | Admitting: Allergy and Immunology

## 2017-05-12 ENCOUNTER — Encounter: Payer: Self-pay | Admitting: Allergy and Immunology

## 2017-05-12 VITALS — BP 126/78 | HR 80 | Resp 18

## 2017-05-12 DIAGNOSIS — J3089 Other allergic rhinitis: Secondary | ICD-10-CM

## 2017-05-12 DIAGNOSIS — T7800XD Anaphylactic reaction due to unspecified food, subsequent encounter: Secondary | ICD-10-CM | POA: Diagnosis not present

## 2017-05-12 MED ORDER — EPINEPHRINE 0.3 MG/0.3ML IJ SOAJ: 0.3000 mg | INTRAMUSCULAR | 1 refills | Status: DC | PRN

## 2017-05-12 NOTE — Patient Instructions (Addendum)
  1. Avoidance measures regarding all mammal consumption  2. EpiPen, Benadryl, M.D./ER evaluation for allergic reaction  3. Return for skin testing next week

## 2017-05-12 NOTE — Progress Notes (Signed)
Follow-up Note  Referring Provider: Lucianne LeiUppin, Nina, MD Primary Provider: Lucianne LeiUppin, Nina, MD Date of Office Visit: 05/12/2017  Subjective:   Alexa Horn (DOB: 10/06/1981) is a 36 y.o. female who [facility] in re-evaluation of the following:  HPI: Pattricia Horn presents this clinic in reevaluation of suspected alpha gal syndrome. Apparently this past week she ate a chicken bagel that may have had traces of bacon and 4 hours later she developed facial itching Itching and hives on her face and neck for which she took Benadryl and an then use an EpiPen about 90 minutes later for lip swelling and mouth numbness. About 10 minutes after the EpiPen she was much better and her entire reaction resolved and 20 minutes.  She does relate a history of having problems with nasal congestion sneezing and itchy red watery eyes especially during the fall season but she doesn't really take much medication with this issue and just kind of suffers through it occasion she will use some Flonase.  Allergies as of 05/12/2017      Reactions   Ceftin [cefuroxime Axetil] Hives   Lactose Intolerance (gi)    Orange Fruit [citrus]    Advised caused yeast infections when she was younger      Medication List      aspirin-acetaminophen-caffeine 250-250-65 MG tablet Commonly known as:  EXCEDRIN MIGRAINE Take 2 tablets by mouth every 6 (six) hours as needed for headache.   CONTRAVE 8-90 MG Tb12 Generic drug:  Naltrexone-Bupropion HCl ER Take 2 tablets by mouth 2 (two) times daily.   diphenhydrAMINE 25 mg capsule Commonly known as:  BENADRYL Take 1 capsule (25 mg total) by mouth every 6 (six) hours as needed.   EPIPEN 2-PAK 0.3 mg/0.3 mL Soaj injection Generic drug:  EPINEPHrine Inject 0.3 mg into the skin as needed for anaphylaxis.   fluticasone 50 MCG/ACT nasal spray Commonly known as:  FLONASE Place 1 spray into both nostrils daily.   SUMAtriptan 50 MG tablet Commonly  known as:  IMITREX Take 1 tablet (50 mg total) by mouth every 2 (two) hours as needed for migraine or headache. May repeat in 2 hours.  Max dose 200mg /day       Past Medical History:  Diagnosis Date  . ADHD (attention deficit hyperactivity disorder)   . History of migraine headaches   . Pancreatitis    dx when she was pregnant, she gets flareups when she eats tomato and decongestant, was seen by [place]Gupta [place]  . Urticaria     Past Surgical History:  Procedure Laterality Date  . ADENOIDECTOMY    . HERNIA REPAIR    . TONSILLECTOMY AND ADENOIDECTOMY    . TUBAL LIGATION    . UPPER GASTROINTESTINAL ENDOSCOPY      Review of systems negative except as noted in HPI / PMHx or noted below:  Review of Systems  Constitutional: Negative.   HENT: Negative.   Eyes: Negative.   Respiratory: Negative.   Cardiovascular: Negative.   Gastrointestinal: Negative.   Genitourinary: Negative.   Musculoskeletal: Negative.   Skin: Negative.   Neurological: Negative.   Endo/Heme/Allergies: Negative.   Psychiatric/Behavioral: Negative.      Objective:   Vitals:   05/12/17 1018  BP: 126/78  Pulse: 80  Resp: 18          Physical Exam  Constitutional: She is well-developed, well-nourished, and in no distress.  HENT:  Head: Normocephalic.  Right Ear: Tympanic  membrane, external ear and ear canal normal.  Left Ear: Tympanic membrane, external ear and ear canal normal.  Nose: Nose normal. No mucosal edema or rhinorrhea.  Mouth/Throat: Uvula is midline, oropharynx is clear and moist and mucous membranes are normal. No oropharyngeal exudate.  Eyes: Conjunctivae are normal.  Neck: Trachea normal. No tracheal tenderness present. No tracheal deviation present. No thyromegaly present.  Cardiovascular: Normal rate, regular rhythm, S1 normal, S2 normal and normal heart sounds.   No murmur heard. Pulmonary/Chest: Breath sounds normal. No stridor. No respiratory distress. She has no  wheezes. She has no rales.  Musculoskeletal: She exhibits no edema.  Lymphadenopathy:       Head (right side): No tonsillar adenopathy present.       Head (left side): No tonsillar adenopathy present.    She has no cervical adenopathy.  Neurological: She is alert. Gait normal.  Skin: No rash noted. She is not diaphoretic. No erythema. Nails show no clubbing.  Psychiatric: Mood and affect normal.    Diagnostics: Results of blood tests obtained to 03/22/2017 identified a IgE titer directed against alpha gal at 2.22 KU/L  Assessment and Plan:   1. Anaphylactic shock due to food, subsequent encounter   2. Other allergic rhinitis     1. Avoidance measures regarding all mammal consumption  2. EpiPen, Benadryl, M.D./ER evaluation for allergic reaction  3. Return for skin testing next week  Lerline will return to this clinic next week for skin testing at which point in time we will look at hypersensitivity directed against foods and aeroallergens. I suspect that her chicken bagel was probably contaminated with some type of mammal product but to be complete we will see if we can detect any additional allergies with skin testing.  Laurette Schimke, MD Allergy / Immunology [facility]

## 2017-05-16 ENCOUNTER — Telehealth: Payer: Self-pay

## 2017-05-16 ENCOUNTER — Encounter: Payer: Self-pay | Admitting: Allergy and Immunology

## 2017-05-16 ENCOUNTER — Ambulatory Visit (INDEPENDENT_AMBULATORY_CARE_PROVIDER_SITE_OTHER): Payer: BLUE CROSS/BLUE SHIELD | Admitting: Allergy and Immunology

## 2017-05-16 VITALS — BP 116/88 | HR 90 | Resp 18

## 2017-05-16 DIAGNOSIS — J3089 Other allergic rhinitis: Secondary | ICD-10-CM | POA: Diagnosis not present

## 2017-05-16 DIAGNOSIS — T7800XD Anaphylactic reaction due to unspecified food, subsequent encounter: Secondary | ICD-10-CM

## 2017-05-16 NOTE — Telephone Encounter (Signed)
Alexa Horn called wondering when the intradermal testing on her arm should stop itching. She had already taken 25mg  of Benadryl about one hour ago.  I told her she can take another Benadryl 25mg  and can use her Allegra if needed and can try some anti-itch cream.  Also reminded her to not scratch arm. .Marland Kitchen

## 2017-05-17 NOTE — Progress Notes (Signed)
Alexa Horn presents this clinic to have skin test performed today. Skin tests directed against a screening panel of foods and aeroallergens were completed. She demonstrated hypersensitivity to cat and dog. She will perform allergen avoidance measures as best as possible.

## 2017-10-11 ENCOUNTER — Encounter (HOSPITAL_COMMUNITY): Payer: Self-pay | Admitting: Emergency Medicine

## 2017-10-11 ENCOUNTER — Emergency Department (HOSPITAL_COMMUNITY): Payer: BLUE CROSS/BLUE SHIELD

## 2017-10-11 ENCOUNTER — Emergency Department (HOSPITAL_COMMUNITY)
Admission: EM | Admit: 2017-10-11 | Discharge: 2017-10-11 | Disposition: A | Discharge status: Home / Self Care | Payer: BLUE CROSS/BLUE SHIELD | Attending: Emergency Medicine | Admitting: Emergency Medicine

## 2017-10-11 DIAGNOSIS — W01198A Fall on same level from slipping, tripping and stumbling with subsequent striking against other object, initial encounter: Secondary | ICD-10-CM | POA: Diagnosis not present

## 2017-10-11 DIAGNOSIS — S93401A Sprain of unspecified ligament of right ankle, initial encounter: Secondary | ICD-10-CM

## 2017-10-11 DIAGNOSIS — W19XXXA Unspecified fall, initial encounter: Secondary | ICD-10-CM

## 2017-10-11 DIAGNOSIS — Y998 Other external cause status: Secondary | ICD-10-CM | POA: Insufficient documentation

## 2017-10-11 DIAGNOSIS — Y92093 Driveway of other non-institutional residence as the place of occurrence of the external cause: Secondary | ICD-10-CM | POA: Insufficient documentation

## 2017-10-11 DIAGNOSIS — Y9389 Activity, other specified: Secondary | ICD-10-CM | POA: Diagnosis not present

## 2017-10-11 DIAGNOSIS — R110 Nausea: Secondary | ICD-10-CM | POA: Insufficient documentation

## 2017-10-11 DIAGNOSIS — S0990XA Unspecified injury of head, initial encounter: Secondary | ICD-10-CM | POA: Diagnosis not present

## 2017-10-11 DIAGNOSIS — Z79899 Other long term (current) drug therapy: Secondary | ICD-10-CM | POA: Insufficient documentation

## 2017-10-11 DIAGNOSIS — R0781 Pleurodynia: Secondary | ICD-10-CM | POA: Diagnosis not present

## 2017-10-11 MED ORDER — ONDANSETRON 4 MG PO TBDP
4.0000 mg | ORAL_TABLET | Freq: Once | ORAL | Status: AC
  Administered 2017-10-11: 4 mg via ORAL
  Filled 2017-10-11: qty 1

## 2017-10-11 MED ORDER — NAPROXEN 375 MG PO TABS: 375.0000 mg | ORAL_TABLET | Freq: Two times a day (BID) | ORAL | 0 refills | Status: DC

## 2017-10-11 MED ORDER — ACETAMINOPHEN 325 MG PO TABS
650.0000 mg | ORAL_TABLET | Freq: Once | ORAL | Status: AC
  Administered 2017-10-11: 650 mg via ORAL
  Filled 2017-10-11: qty 2

## 2017-10-11 NOTE — ED Triage Notes (Signed)
Pt was walking into work and slipped onto ice and fell onto large piece of ice on right side of ribs and twisted right ankle. Pt also hit the back of her head and feels nauseous.

## 2017-10-11 NOTE — ED Notes (Signed)
Pt transported to xray 

## 2017-10-11 NOTE — ED Notes (Signed)
See provider note for assessment

## 2017-10-11 NOTE — ED Notes (Addendum)
Pt stated that neither the Zofran nor Tylenol alleviated her symptoms and requested either additional medication or an alternative. Continued to complain of head/neck pain, nausea, and light sensitivity.

## 2017-10-11 NOTE — ED Provider Notes (Signed)
[facility] Provider Note   CSN: [id number] Arrival date & time: 10/11/17  [id number]     History   Chief Complaint Chief Complaint  Patient presents with  . Fall    HPI Alexa Horn is a 36 y.o. female.  Alexa Horn is a 36 y.o. Female who presents to the emergency department following a slip and fall on ice today.  Patient reports she was getting some items out of her vehicle when she slipped on black ice falling on to her right ribs and the back of her head.  She is unsure if she lost consciousness and believes she might have.  She reports she was holding her coffee in the next thing she knew who is flying up in the air and she was on the ground.  She complains of pain to the back of her head as well as feeling nauseated.  She also complains of right rib pain.  No shortness of breath.  She also complains of right ankle pain that is worse with movement.  No treatments attempted prior to arrival.  She denies fevers, numbness, tingling, weakness, double vision, neck pain, back pain, shortness of breath, abdominal pain, vomiting, or rashes.   The history is provided by the patient and medical records. No language interpreter was used.  Fall  Pertinent negatives include no chest pain, no abdominal pain, no headaches and no shortness of breath.    Past Medical History:  Diagnosis Date  . ADHD (attention deficit hyperactivity disorder)   . History of migraine headaches   . Pancreatitis    dx when she was pregnant, she gets flareups when she eats tomato and decongestant, was seen by Dr Chales [place]rah[place]Gupta in [place]  . Urticaria     There are no active problems to display for this patient.   Past Surgical History:  Procedure Laterality Date  . ADENOIDECTOMY    . HERNIA REPAIR    . TONSILLECTOMY AND ADENOIDECTOMY    . TUBAL LIGATION    . UPPER GASTROINTESTINAL ENDOSCOPY      OB History    No data available       Home Medications    Prior to  Admission medications   Medication Sig Start Date End Date Taking? Authorizing Provider  aspirin-acetaminophen-caffeine (EXCEDRIN MIGRAINE) [id number] MG tablet Take 2 tablets by mouth every 6 (six) hours as needed for headache.    [provider]  CONTRAVE 8-90 MG TB12 Take 2 tablets by mouth 2 (two) times daily.  02/11/17   [provider]  diphenhydrAMINE (BENADRYL) 25 mg capsule Take 1 capsule (25 mg total) by mouth every 6 (six) hours as needed. 03/09/17   Audry PiliMohr, Tyler, PA-C  EPINEPHrine (EPIPEN 2-PAK) 0.3 mg/0.3 mL IJ SOAJ injection Inject 0.3 mLs (0.3 mg total) into the skin as needed. 05/12/17   Kozlow, Alvira PhilipsEric J, MD  fluticasone (FLONASE) 50 MCG/ACT nasal spray Place 1 spray into both nostrils daily. 03/24/17   [provider]  naproxen (NAPROSYN) 375 MG tablet Take 1 tablet (375 mg total) by mouth 2 (two) times daily with a meal. 10/11/17   Everlene Farrieransie, Iden Stripling, PA-C  SUMAtriptan (IMITREX) 50 MG tablet Take 1 tablet (50 mg total) by mouth every 2 (two) hours as needed for migraine or headache. May repeat in 2 hours.  Max dose 200mg /day 11/07/13   Godfrey PickEgan, Eleanore E, PA-C    Family History Family History  Problem Relation Age of Onset  . Breast cancer Maternal  Aunt   . Breast cancer Maternal Grandmother   . Heart disease Maternal Grandfather   . Hypercholesterolemia Maternal Grandfather   . Hypertension Maternal Grandfather     Social History Social History   Tobacco Use  . Smoking status: Never Smoker  . Smokeless tobacco: Never Used  Substance Use Topics  . Alcohol use: No  . Drug use: No     Allergies   Ceftin [cefuroxime axetil]; Lactose intolerance (gi); and Orange fruit [citrus]   Review of Systems Review of Systems  Constitutional: Negative for fever.  HENT: Negative for nosebleeds.   Eyes: Negative for visual disturbance.  Respiratory: Negative for cough and shortness of breath.   Cardiovascular: Negative for chest pain.  Gastrointestinal:  Positive for nausea. Negative for abdominal pain and vomiting.  Genitourinary: Negative for difficulty urinating and dysuria.  Musculoskeletal: Positive for arthralgias. Negative for back pain and neck pain.  Skin: Negative for rash.  Neurological: Negative for dizziness, weakness, light-headedness, numbness and headaches.     Physical Exam Updated Vital Signs BP (!) 149/90 (BP Location: Right Arm)   Pulse 98   Temp 98.2 F (36.8 C) (Oral)   Resp 16   LMP 09/20/2017   SpO2 100%   Physical Exam  Constitutional: She is oriented to person, place, and time. She appears well-developed and well-nourished. No distress.  Nontoxic appearing.  HENT:  Head: Normocephalic and atraumatic.  Right Ear: External ear normal.  Left Ear: External ear normal.  Mouth/Throat: Oropharynx is clear and moist.  No visible or palpated signs of head injury or trauma.  No hemotympanum bilaterally.  Eyes: Conjunctivae and EOM are normal. Pupils are equal, round, and reactive to light. Right eye exhibits no discharge. Left eye exhibits no discharge.  Neck: Normal range of motion. Neck supple.  No midline neck tenderness to palpation.  Cardiovascular: Normal rate, regular rhythm, normal heart sounds and intact distal pulses.  Bilateral radial, posterior tibialis and dorsalis pedis pulses are intact.    Pulmonary/Chest: Effort normal and breath sounds normal. No respiratory distress. She has no wheezes. She has no rales. She exhibits tenderness.  Right lateral chest wall is tender to palpation.  No crepitus or overlying skin changes.  No ecchymosis or deformity.  Symmetric chest expansion bilaterally.  Lungs are clear to auscultation bilaterally.  Abdominal: Soft. There is no tenderness. There is no guarding.  Musculoskeletal: Normal range of motion. She exhibits tenderness. She exhibits no edema or deformity.  Tenderness diffusely to her right ankle.  No ankle instability noted.  No ankle edema or ecchymosis  noted.  Patient's bilateral clavicles are nontender to palpation.  No midline neck or back tenderness.  Patient's bilateral shoulder, elbow, wrist, hip and knee joints are supple and nontender to palpation.  Lymphadenopathy:    She has no cervical adenopathy.  Neurological: She is alert and oriented to person, place, and time. No cranial nerve deficit or sensory deficit. She exhibits normal muscle tone. Coordination normal.  Patient is alert and oriented 3. Speech is clear and coherent. Cranial nerves are intact. Sensation and strength is intact to bilateral upper and lower extremities.   Skin: Skin is warm and dry. Capillary refill takes less than 2 seconds. No rash noted. She is not diaphoretic. No erythema. No pallor.  Psychiatric: She has a normal mood and affect. Her behavior is normal.  Nursing note and vitals reviewed.    ED Treatments / Results  Labs (all labs ordered are listed, but only abnormal results are  displayed) Labs Reviewed - No data to display  EKG  EKG Interpretation None       Radiology Dg Ribs Unilateral W/chest Right  Result Date: 10/11/2017 CLINICAL DATA:  Pain following fall EXAM: RIGHT RIBS AND CHEST - 3+ VIEW COMPARISON:  Chest radiograph January 27, 2012 FINDINGS: Frontal chest as well as oblique and cone-down rib images obtained. Lungs are clear. Heart size and pulmonary vascularity are normal. No adenopathy. No pneumothorax or pleural effusion evident. No rib fracture apparent. IMPRESSION: No appreciable rib fracture.  Lungs clear.  No evident pneumothorax. Electronically Signed   By: Bretta Bang III M.D.   On: 10/11/2017 10:33   Dg Ankle Complete Right  Result Date: 10/11/2017 CLINICAL DATA:  Pain following fall EXAM: RIGHT ANKLE - COMPLETE 3+ VIEW COMPARISON:  January 27, 2017 FINDINGS: Frontal, oblique, and lateral views were obtained. There is no acute fracture or joint effusion. A small calcification in the medial malleolus is stable in may  represent residua of old trauma. Joint spaces appear normal. No erosive change. Ankle mortise appears intact. IMPRESSION: No acute fracture or joint effusion. Question old trauma medial malleolus with small well corticated calcification in this area. Ankle mortise appears intact. No appreciable joint space narrowing. Electronically Signed   By: Bretta Bang III M.D.   On: 10/11/2017 10:34   Ct Head Wo Contrast  Result Date: 10/11/2017 CLINICAL DATA:  Slipped on ice, hit back of head. EXAM: CT HEAD WITHOUT CONTRAST TECHNIQUE: Contiguous axial images were obtained from the base of the skull through the vertex without intravenous contrast. COMPARISON:  MRI 03/11/2017 FINDINGS: Brain: No acute intracranial abnormality. Specifically, no hemorrhage, hydrocephalus, mass lesion, acute infarction, or significant intracranial injury. Vascular: No hyperdense vessel or unexpected calcification. Skull: No acute calvarial abnormality. Sinuses/Orbits: Visualized paranasal sinuses and mastoids clear. Orbital soft tissues unremarkable. Other: None IMPRESSION: Normal study. Electronically Signed   By: Charlett Nose M.D.   On: 10/11/2017 11:07    Procedures Procedures (including critical care time)  Medications Ordered in ED Medications  ondansetron (ZOFRAN-ODT) disintegrating tablet 4 mg (4 mg Oral Given 10/11/17 0956)  acetaminophen (TYLENOL) tablet 650 mg (650 mg Oral Given 10/11/17 0956)     Initial Impression / Assessment and Plan / ED Course  I have reviewed the triage vital signs and the nursing notes.  Pertinent labs & imaging results that were available during my care of the patient were reviewed by me and considered in my medical decision making (see chart for details).    This is a 35 y.o. Female who presents to the emergency department following a slip and fall on ice today.  Patient reports she was getting some items out of her vehicle when she slipped on black ice falling on to her right ribs  and the back of her head.  She is unsure if she lost consciousness and believes she might have.  She reports she was holding her coffee in the next thing she knew who is flying up in the air and she was on the ground.  She complains of pain to the back of her head as well as feeling nauseated.  She also complains of right rib pain.  No shortness of breath.  She also complains of right ankle pain that is worse with movement. On exam the patient is afebrile nontoxic-appearing.  She has no focal neurological deficits.  She has no midline neck or back tenderness.  She has mild tenderness over her right lateral chest wall  without overlying skin changes or crepitus.  Symmetric chest expansion bilaterally.  No hypoxia.  She also has tenderness to her right ankle. With patient's unknown loss of consciousness and her complaint of nausea head CT was obtained and this was unremarkable. Right ribs of the chest show no evidence of rib fracture or pneumothorax. Right ankle x-ray shows no acute fracture or joint effusion.  It does question an old trauma to the medial malleolus with a small well corticated calcification in this area.  Ankle mortise is intact.  No joint space narrowing.  At reevaluation patient has no tenderness to her medial malleolus.  Suspect ankle sprain.  We will place an ASO ankle brace and have her nonweightbearing until she is feeling better.  Other workup is reassuring.  Naproxen for pain control.  Return precautions discussed. I advised the patient to follow-up with their primary care provider this week. I advised the patient to return to the emergency department with new or worsening symptoms or new concerns. The patient verbalized understanding and agreement with plan.    Final Clinical Impressions(s) / ED Diagnoses   Final diagnoses:  Fall, initial encounter  Minor head injury, initial encounter  Sprain of right ankle, unspecified ligament, initial encounter  Rib pain on right side    ED  Discharge Orders        Ordered    naproxen (NAPROSYN) 375 MG tablet  2 times daily with meals     10/11/17 1140       Everlene FarrierDansie, Buren Havey, PA-C 10/11/17 1145    Loren RacerYelverton, David, MD 10/12/17 1004

## 2017-10-17 ENCOUNTER — Encounter: Payer: Self-pay | Admitting: Allergy and Immunology

## 2017-10-17 ENCOUNTER — Ambulatory Visit: Payer: BLUE CROSS/BLUE SHIELD | Admitting: Allergy and Immunology

## 2017-10-17 ENCOUNTER — Ambulatory Visit (INDEPENDENT_AMBULATORY_CARE_PROVIDER_SITE_OTHER): Payer: BLUE CROSS/BLUE SHIELD | Admitting: Allergy and Immunology

## 2017-10-17 VITALS — BP 140/82 | HR 76 | Resp 20

## 2017-10-17 DIAGNOSIS — J3089 Other allergic rhinitis: Secondary | ICD-10-CM | POA: Diagnosis not present

## 2017-10-17 DIAGNOSIS — T7800XD Anaphylactic reaction due to unspecified food, subsequent encounter: Secondary | ICD-10-CM | POA: Diagnosis not present
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
## 2017-10-17 NOTE — Patient Instructions (Signed)
  1. Avoidance measures ?  2. EpiPen, Benadryl, M.D./ER evaluation for allergic reaction. Contact clinic when time to refill: [id number]  3. Use OTC Nasacort / Flonase 1 spray each nostril one time per day  4. Use OTC antihistamine if needed  5. Further evaluation and treatment?  6. Return to clinic in one year or earlier if problem.

## 2017-10-18 ENCOUNTER — Encounter: Payer: Self-pay | Admitting: Allergy and Immunology

## 2018-05-01 DIAGNOSIS — A09 Infectious gastroenteritis and colitis, unspecified: Secondary | ICD-10-CM

## 2018-05-01 HISTORY — DX: Infectious gastroenteritis and colitis, unspecified: A09

## 2018-05-07 ENCOUNTER — Other Ambulatory Visit: Payer: Self-pay

## 2018-05-07 ENCOUNTER — Encounter (HOSPITAL_COMMUNITY): Payer: Self-pay | Admitting: Emergency Medicine

## 2018-05-07 ENCOUNTER — Emergency Department (HOSPITAL_COMMUNITY): Payer: BLUE CROSS/BLUE SHIELD

## 2018-05-07 ENCOUNTER — Inpatient Hospital Stay (HOSPITAL_COMMUNITY)
Admission: EM | Admit: 2018-05-07 | Discharge: 2018-05-13 | DRG: 373 | Disposition: A | Discharge status: Home / Self Care | Payer: BLUE CROSS/BLUE SHIELD | Attending: Internal Medicine | Admitting: Internal Medicine

## 2018-05-07 DIAGNOSIS — A045 Campylobacter enteritis: Principal | ICD-10-CM | POA: Diagnosis present

## 2018-05-07 DIAGNOSIS — Z8349 Family history of other endocrine, nutritional and metabolic diseases: Secondary | ICD-10-CM | POA: Diagnosis not present

## 2018-05-07 DIAGNOSIS — G43909 Migraine, unspecified, not intractable, without status migrainosus: Secondary | ICD-10-CM | POA: Diagnosis present

## 2018-05-07 DIAGNOSIS — Z91011 Allergy to milk products: Secondary | ICD-10-CM | POA: Diagnosis not present

## 2018-05-07 DIAGNOSIS — Z885 Allergy status to narcotic agent status: Secondary | ICD-10-CM | POA: Diagnosis not present

## 2018-05-07 DIAGNOSIS — F319 Bipolar disorder, unspecified: Secondary | ICD-10-CM | POA: Diagnosis present

## 2018-05-07 DIAGNOSIS — Z91018 Allergy to other foods: Secondary | ICD-10-CM

## 2018-05-07 DIAGNOSIS — Z9089 Acquired absence of other organs: Secondary | ICD-10-CM | POA: Diagnosis not present

## 2018-05-07 DIAGNOSIS — A09 Infectious gastroenteritis and colitis, unspecified: Secondary | ICD-10-CM | POA: Diagnosis not present

## 2018-05-07 DIAGNOSIS — Z91048 Other nonmedicinal substance allergy status: Secondary | ICD-10-CM

## 2018-05-07 DIAGNOSIS — Z915 Personal history of self-harm: Secondary | ICD-10-CM

## 2018-05-07 DIAGNOSIS — Z8249 Family history of ischemic heart disease and other diseases of the circulatory system: Secondary | ICD-10-CM

## 2018-05-07 DIAGNOSIS — Z6836 Body mass index (BMI) 36.0-36.9, adult: Secondary | ICD-10-CM | POA: Diagnosis not present

## 2018-05-07 DIAGNOSIS — Z79899 Other long term (current) drug therapy: Secondary | ICD-10-CM

## 2018-05-07 DIAGNOSIS — K0889 Other specified disorders of teeth and supporting structures: Secondary | ICD-10-CM | POA: Diagnosis present

## 2018-05-07 DIAGNOSIS — L500 Allergic urticaria: Secondary | ICD-10-CM | POA: Diagnosis present

## 2018-05-07 DIAGNOSIS — R161 Splenomegaly, not elsewhere classified: Secondary | ICD-10-CM | POA: Diagnosis present

## 2018-05-07 DIAGNOSIS — E876 Hypokalemia: Secondary | ICD-10-CM | POA: Diagnosis not present

## 2018-05-07 DIAGNOSIS — Z803 Family history of malignant neoplasm of breast: Secondary | ICD-10-CM | POA: Diagnosis not present

## 2018-05-07 DIAGNOSIS — Z8719 Personal history of other diseases of the digestive system: Secondary | ICD-10-CM | POA: Diagnosis not present

## 2018-05-07 DIAGNOSIS — Z888 Allergy status to other drugs, medicaments and biological substances status: Secondary | ICD-10-CM

## 2018-05-07 DIAGNOSIS — R196 Halitosis: Secondary | ICD-10-CM | POA: Diagnosis present

## 2018-05-07 DIAGNOSIS — K529 Noninfective gastroenteritis and colitis, unspecified: Secondary | ICD-10-CM

## 2018-05-07 DIAGNOSIS — Z9851 Tubal ligation status: Secondary | ICD-10-CM | POA: Diagnosis not present

## 2018-05-07 DIAGNOSIS — R112 Nausea with vomiting, unspecified: Secondary | ICD-10-CM

## 2018-05-07 DIAGNOSIS — G44009 Cluster headache syndrome, unspecified, not intractable: Secondary | ICD-10-CM

## 2018-05-07 DIAGNOSIS — R000 Tachycardia, unspecified: Secondary | ICD-10-CM | POA: Diagnosis present

## 2018-05-07 DIAGNOSIS — Z881 Allergy status to other antibiotic agents status: Secondary | ICD-10-CM

## 2018-05-07 DIAGNOSIS — N83209 Unspecified ovarian cyst, unspecified side: Secondary | ICD-10-CM | POA: Diagnosis present

## 2018-05-07 DIAGNOSIS — E669 Obesity, unspecified: Secondary | ICD-10-CM | POA: Diagnosis present

## 2018-05-07 LAB — COMPREHENSIVE METABOLIC PANEL
ALT: 18 U/L (ref 0–44)
AST: 22 U/L (ref 15–41)
Albumin: 3.7 g/dL (ref 3.5–5.0)
Alkaline Phosphatase: 64 U/L (ref 38–126)
Anion gap: 7 (ref 5–15)
BUN: 6 mg/dL (ref 6–20)
CO2: 25 mmol/L (ref 22–32)
Calcium: 8.6 mg/dL — ABNORMAL LOW (ref 8.9–10.3)
Chloride: 103 mmol/L (ref 98–111)
Creatinine, Ser: 0.91 mg/dL (ref 0.44–1.00)
GFR calc Af Amer: >60 mL/min (ref >60)
GFR calc non Af Amer: >60 mL/min (ref >60)
Glucose, Bld: 116 mg/dL — ABNORMAL HIGH (ref 70–99)
Potassium: 3.5 mmol/L (ref 3.5–5.1)
Sodium: 135 mmol/L (ref 135–145)
Total Bilirubin: 0.5 mg/dL (ref 0.3–1.2)
Total Protein: 7.3 g/dL (ref 6.5–8.1)

## 2018-05-07 LAB — CBC
HCT: 44 % (ref 36.0–46.0)
Hemoglobin: 14.3 g/dL (ref 12.0–15.0)
MCH: 29.5 pg (ref 26.0–34.0)
MCHC: 32.5 g/dL (ref 30.0–36.0)
MCV: 90.9 fL (ref 78.0–100.0)
Platelets: 219 10*3/uL (ref 150–400)
RBC: 4.84 MIL/uL (ref 3.87–5.11)
RDW: 12 % (ref 11.5–15.5)
WBC: 11.7 10*3/uL — ABNORMAL HIGH (ref 4.0–10.5)

## 2018-05-07 LAB — URINALYSIS, ROUTINE W REFLEX MICROSCOPIC
Glucose, UA: NEGATIVE mg/dL
Ketones, ur: NEGATIVE mg/dL
Leukocytes, UA: NEGATIVE
Nitrite: NEGATIVE
Protein, ur: 30 mg/dL — AB
Specific Gravity, Urine: 1.02 (ref 1.005–1.030)
pH: 6 (ref 5.0–8.0)

## 2018-05-07 LAB — URINALYSIS, MICROSCOPIC (REFLEX): RBC / HPF: NONE SEEN RBC/hpf (ref 0–5)

## 2018-05-07 LAB — I-STAT CG4 LACTIC ACID, ED: Lactic Acid, Venous: 0.47 mmol/L — ABNORMAL LOW (ref 0.5–1.9)

## 2018-05-07 LAB — I-STAT BETA HCG BLOOD, ED (MC, WL, AP ONLY): I-stat hCG, quantitative: <5 m[IU]/mL (ref <5)

## 2018-05-07 LAB — LIPASE, BLOOD: Lipase: 35 U/L (ref 11–51)

## 2018-05-07 MED ORDER — SERTRALINE HCL 100 MG PO TABS
100.0000 mg | ORAL_TABLET | Freq: Every day | ORAL | Status: DC
  Administered 2018-05-08 – 2018-05-13 (×6): 100 mg via ORAL
  Filled 2018-05-07 (×6): qty 1

## 2018-05-07 MED ORDER — METOCLOPRAMIDE HCL 5 MG/ML IJ SOLN
10.0000 mg | Freq: Once | INTRAMUSCULAR | Status: AC
  Administered 2018-05-07: 10 mg via INTRAVENOUS
  Filled 2018-05-07: qty 2

## 2018-05-07 MED ORDER — LORATADINE 10 MG PO TABS
10.0000 mg | ORAL_TABLET | Freq: Every day | ORAL | Status: DC
  Administered 2018-05-07 – 2018-05-13 (×7): 10 mg via ORAL
  Filled 2018-05-07 (×6): qty 1

## 2018-05-07 MED ORDER — PROMETHAZINE HCL 25 MG/ML IJ SOLN
25.0000 mg | Freq: Four times a day (QID) | INTRAMUSCULAR | Status: DC | PRN
  Administered 2018-05-07 – 2018-05-13 (×15): 25 mg via INTRAVENOUS
  Filled 2018-05-07 (×16): qty 1

## 2018-05-07 MED ORDER — HYDROMORPHONE HCL 1 MG/ML IJ SOLN
0.5000 mg | Freq: Once | INTRAMUSCULAR | Status: AC
  Administered 2018-05-07: 0.5 mg via INTRAVENOUS
  Filled 2018-05-07: qty 1

## 2018-05-07 MED ORDER — METRONIDAZOLE 500 MG PO TABS
500.0000 mg | ORAL_TABLET | Freq: Once | ORAL | Status: AC
  Administered 2018-05-07: 500 mg via ORAL
  Filled 2018-05-07: qty 1

## 2018-05-07 MED ORDER — PROMETHAZINE HCL 25 MG/ML IJ SOLN
25.0000 mg | Freq: Once | INTRAMUSCULAR | Status: AC
  Administered 2018-05-07: 25 mg via INTRAVENOUS
  Filled 2018-05-07: qty 1

## 2018-05-07 MED ORDER — SODIUM CHLORIDE 0.9 % IV BOLUS
1000.0000 mL | Freq: Once | INTRAVENOUS | Status: AC
  Administered 2018-05-07: 1000 mL via INTRAVENOUS

## 2018-05-07 MED ORDER — IOHEXOL 300 MG/ML  SOLN
100.0000 mL | Freq: Once | INTRAMUSCULAR | Status: AC | PRN
  Administered 2018-05-07: 100 mL via INTRAVENOUS

## 2018-05-07 MED ORDER — ASPIRIN-ACETAMINOPHEN-CAFFEINE 250-250-65 MG PO TABS
2.0000 | ORAL_TABLET | Freq: Four times a day (QID) | ORAL | Status: DC | PRN
  Administered 2018-05-08 – 2018-05-09 (×2): 2 via ORAL
  Filled 2018-05-07 (×3): qty 2

## 2018-05-07 MED ORDER — ENOXAPARIN SODIUM 40 MG/0.4ML ~~LOC~~ SOLN
40.0000 mg | SUBCUTANEOUS | Status: DC
  Filled 2018-05-07: qty 0.4

## 2018-05-07 MED ORDER — MORPHINE SULFATE (PF) 4 MG/ML IV SOLN
2.0000 mg | INTRAVENOUS | Status: DC | PRN
  Administered 2018-05-07 – 2018-05-13 (×25): 2 mg via INTRAVENOUS
  Filled 2018-05-07 (×25): qty 1

## 2018-05-07 MED ORDER — KETOROLAC TROMETHAMINE 30 MG/ML IJ SOLN
30.0000 mg | Freq: Four times a day (QID) | INTRAMUSCULAR | Status: DC | PRN
  Administered 2018-05-08 – 2018-05-09 (×2): 30 mg via INTRAVENOUS
  Filled 2018-05-07 (×2): qty 1

## 2018-05-07 MED ORDER — PROMETHAZINE HCL 25 MG PO TABS: 25.0000 mg | ORAL_TABLET | Freq: Once | ORAL | Status: DC

## 2018-05-07 MED ORDER — TOPIRAMATE 25 MG PO TABS
50.0000 mg | ORAL_TABLET | Freq: Two times a day (BID) | ORAL | Status: DC
  Administered 2018-05-07 – 2018-05-09 (×4): 50 mg via ORAL
  Filled 2018-05-07 (×10): qty 2

## 2018-05-07 MED ORDER — PROMETHAZINE HCL 25 MG/ML IJ SOLN
12.5000 mg | Freq: Four times a day (QID) | INTRAMUSCULAR | Status: DC | PRN
  Administered 2018-05-07: 12.5 mg via INTRAVENOUS
  Filled 2018-05-07: qty 1

## 2018-05-07 MED ORDER — SODIUM CHLORIDE 0.9 % IV SOLN
INTRAVENOUS | Status: DC
  Administered 2018-05-07 – 2018-05-13 (×10): via INTRAVENOUS

## 2018-05-07 MED ORDER — ONDANSETRON HCL 4 MG/2ML IJ SOLN: 4.0000 mg | Freq: Once | INTRAMUSCULAR | Status: DC

## 2018-05-07 MED ORDER — CIPROFLOXACIN HCL 500 MG PO TABS
500.0000 mg | ORAL_TABLET | Freq: Once | ORAL | Status: AC
  Administered 2018-05-07: 500 mg via ORAL
  Filled 2018-05-07: qty 1

## 2018-05-07 NOTE — ED Triage Notes (Signed)
Pt reports body aches that began Friday, started having diarrhea on Saturday and vomiting this am, hx of pancreatitis and reports her symptoms feel similar to past episodes of that.

## 2018-05-07 NOTE — ED Notes (Signed)
Pt returns from ct scan, family at bedside.

## 2018-05-07 NOTE — ED Notes (Signed)
Attempted blood draw. Unsuccessful 
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
## 2018-05-07 NOTE — ED Provider Notes (Signed)
[facility] Provider Note   CSN: [id number] Arrival date & time: 05/07/18  1008     History   Chief Complaint Chief Complaint  Patient presents with  . Diarrhea  . Emesis    HPI Hale Dronennie B Heinze is a 37 y.o. female.  Patient with history of pancreatitis, recent ankle surgery --presents to the emergency department with 2 to 3 days of diarrhea with fever to 103F and vomiting starting this morning.  Patient describes generalized pain in her body and extreme sensitivity of her skin.  Pain is focused across the upper part of her abdomen.  She has a history of tubal ligation and hernia repair.  She still has her gallbladder and her appendix.  No urinary symptoms.  Diarrhea is nonbloody.  No recent travel or suspicious food exposures.  Patient has been taking Tylenol at home with temporary improvement in temperature to about 99 F. The onset of this condition was acute. The course is constant. Aggravating factors: none. Alleviating factors: none. The onset of this condition was acute. The course is worsening. Aggravating factors: none. Alleviating factors: none.        Past Medical History:  Diagnosis Date  . ADHD (attention deficit hyperactivity disorder)   . History of migraine headaches   . Pancreatitis    dx when she was pregnant, she gets flareups when she eats tomato and decongestant, was seen by [place]Gupta in [place]  . Urticaria     There are no active problems to display for this patient.   Past Surgical History:  Procedure Laterality Date  . ADENOIDECTOMY    . HERNIA REPAIR    . TONSILLECTOMY AND ADENOIDECTOMY    . TUBAL LIGATION    . UPPER GASTROINTESTINAL ENDOSCOPY       OB History   None      Home Medications    Prior to Admission medications   Medication Sig Start Date End Date Taking? Authorizing Provider  aspirin-acetaminophen-caffeine (EXCEDRIN MIGRAINE) [phone] MG tablet Take 2 tablets by mouth every 6 (six)  hours as needed for headache.   Yes [provider]  cetirizine (ZYRTEC) 10 MG tablet Take 10 mg by mouth at bedtime.   Yes [provider]  diphenhydrAMINE (BENADRYL) 25 mg capsule Take 1 capsule (25 mg total) by mouth every 6 (six) hours as needed. 03/09/17  Yes Audry PiliMohr, [place], PA-C  EPINEPHrine (EPIPEN 2-PAK) 0.3 mg/0.3 mL IJ SOAJ injection Inject 0.3 mLs (0.3 mg total) into the skin as needed. 05/12/17  Yes Kozlow, Alvira PhilipsEric J, MD  phentermine (ADIPEX-P) 37.5 MG tablet Take 37.5 mg by mouth daily.  09/26/17  Yes [provider]  sertraline (ZOLOFT) 100 MG tablet Take 100 mg by mouth daily. 04/26/18  Yes [provider]  topiramate (TOPAMAX) 50 MG tablet Take 50 mg by mouth 2 (two) times daily.  09/19/17  Yes [provider]  naproxen (NAPROSYN) 375 MG tablet Take 1 tablet (375 mg total) by mouth 2 (two) times daily with a meal. Patient not taking: Reported on 05/07/2018 10/11/17   Everlene Farrieransie, William, PA-C    Family History Family History  Problem Relation Age of Onset  . Breast cancer Maternal Aunt   . Breast cancer Maternal Grandmother   . Heart disease Maternal Grandfather   . Hypercholesterolemia Maternal Grandfather   . Hypertension Maternal Grandfather     Social History Social History   Tobacco Use  . Smoking status: Never Smoker  . Smokeless tobacco: Never  Used  Substance Use Topics  . Alcohol use: No  . Drug use: No     Allergies   Ceftin [cefuroxime axetil]; Hydrocodone-acetaminophen; Percocet [oxycodone-acetaminophen]; Tape; Bioflavonoids; Lactose intolerance (gi); and Orange fruit [citrus]   Review of Systems Review of Systems  Constitutional: Negative for fever.  HENT: Negative for rhinorrhea and sore throat.   Eyes: Negative for redness.  Respiratory: Negative for cough.   Cardiovascular: Negative for chest pain.  Gastrointestinal: Positive for abdominal pain, diarrhea, nausea and vomiting.  Genitourinary: Negative for dysuria.   Musculoskeletal: Positive for myalgias and neck pain.  Skin: Negative for rash.  Neurological: Positive for headaches.     Physical Exam Updated Vital Signs BP 120/82 (BP Location: Right Arm)   Pulse (!) 136   Temp 99.9 F (37.7 C) (Oral)   Resp 20   Ht 5\' 1"  (1.549 m)   Wt 86.2 kg (190 lb)   SpO2 97%   BMI 35.90 kg/m   Physical Exam  Constitutional: She appears well-developed and well-nourished.  HENT:  Head: Normocephalic and atraumatic.  Mouth/Throat: Oropharynx is clear and moist.  Eyes: Conjunctivae are normal. Right eye exhibits no discharge. Left eye exhibits no discharge.  Neck: Normal range of motion. Neck supple.  No meningeal signs.  Cardiovascular: Regular rhythm and normal heart sounds. Tachycardia present.  Pulmonary/Chest: Effort normal and breath sounds normal. No respiratory distress. She has no wheezes. She has no rales.  Abdominal: Soft. There is tenderness. There is guarding. There is no rebound.  Moderate abd pain epigastric to RUQ  Neurological: She is alert.  Skin: Skin is warm and dry.  Psychiatric: She has a normal mood and affect.  Nursing note and vitals reviewed.    ED Treatments / Results  Labs (all labs ordered are listed, but only abnormal results are displayed) Labs Reviewed  COMPREHENSIVE METABOLIC PANEL - Abnormal; Notable for the following components:      Result Value   Glucose, Bld 116 (*)    Calcium 8.6 (*)    All other components within normal limits  CBC - Abnormal; Notable for the following components:   WBC 11.7 (*)    All other components within normal limits  URINALYSIS, ROUTINE W REFLEX MICROSCOPIC - Abnormal; Notable for the following components:   Color, Urine YELLOW (*)    APPearance CLEAR (*)    Hgb urine dipstick TRACE (*)    Bilirubin Urine SMALL (*)    Protein, ur 30 (*)    All other components within normal limits  URINALYSIS, MICROSCOPIC (REFLEX) - Abnormal; Notable for the following components:    Bacteria, UA FEW (*)    All other components within normal limits  I-STAT CG4 LACTIC ACID, ED - Abnormal; Notable for the following components:   Lactic Acid, Venous 0.47 (*)    All other components within normal limits  LIPASE, BLOOD  I-STAT BETA HCG BLOOD, ED ([facility], [facility], [facility] ONLY)    EKG None  Radiology Ct Abdomen Pelvis W Contrast  Result Date: 05/07/2018 CLINICAL DATA:  Abdominal pain and fever EXAM: CT ABDOMEN AND PELVIS WITH CONTRAST TECHNIQUE: Multidetector CT imaging of the abdomen and pelvis was performed using the standard protocol following bolus administration of intravenous contrast. CONTRAST:  OMNIPAQUE IOHEXOL 300 MG/ML  SOLN COMPARISON:  CT abdomen June 21, 2011 FINDINGS: Lower chest: Lung bases are clear. Hepatobiliary: There is mild fatty infiltration near the fissure for the ligamentum teres. No focal liver lesions are evident. Gallbladder wall is not appreciably  thickened. There is no biliary duct dilatation. Pancreas: No pancreatic mass or inflammatory focus. Spleen: Spleen measures 13.4 x 10.4 x 7.4 cm with a measured splenic volume of 516 cubic cm. No focal splenic lesions are evident. Adrenals/Urinary Tract: Adrenals bilaterally appear normal. Kidneys bilaterally show no evident mass or hydronephrosis on either side. There is no appreciable renal or ureteral calculus on either side. Urinary bladder is midline with wall thickness within normal limits. Stomach/Bowel: There is wall thickening involving the ascending and transverse colon regions. There is mild surrounding mesenteric thickening in these areas. No evident fistula. Other loops of bowel appear unremarkable. No evident bowel obstruction. No free air or portal venous air. Vascular/Lymphatic: No abdominal aortic aneurysm. No vascular lesions are evident on this study. There are several lymph nodes in the right abdomen near this cecum which are borderline prominent. Largest lymph node in this area measures 1.2 x 1.2 cm.  No lymph node enlargement seen elsewhere. Reproductive: Uterus is anteverted. There are tubal ligation clips bilaterally. There is an apparent follicle in the right ovary measuring 1.6 x 1.6 cm. A fairly small amount of fluid tracks from the right ovary into the cul-de-sac region. Other: Appendix not appreciable. There is not felt to be appendiceal inflammation on this study. No abscess is evident. There is no ascites beyond mild fluid in the cul-de-sac region tracking from the right ovary. Musculoskeletal: No blastic or lytic bone lesions. No intramuscular or abdominal wall lesions. IMPRESSION: 1. Evidence of colitis involving the ascending and transverse colon regions. No bowel obstruction. No abscess in the abdomen or pelvis. Appendix not seen. No periappendiceal region inflammation is evident. 2. Mildly prominent right lower quadrant lymph nodes, likely in response to the colitis. A degree of mesenteric adenitis is also possible. 3. Fluid tracks from the right ovary into the cul-de-sac, indicative of recent ovarian cyst rupture. 4.  Splenic prominence of uncertain etiology. 5.  No evident renal or ureteral calculus.  No hydronephrosis. 6.  Tubal ligation clips bilaterally. Electronically Signed   By: Bretta Bang III M.D.   On: 05/07/2018 14:07    Procedures Procedures (including critical care time)  Medications Ordered in ED Medications  metoCLOPramide (REGLAN) injection 10 mg (has no administration in time range)  0.9 %  sodium chloride infusion (has no administration in time range)  sodium chloride 0.9 % bolus 1,000 mL (0 mLs Intravenous Stopped 05/07/18 1256)  HYDROmorphone (DILAUDID) injection 0.5 mg (0.5 mg Intravenous Given 05/07/18 1147)  promethazine (PHENERGAN) injection 25 mg (25 mg Intravenous Given 05/07/18 1146)  iohexol (OMNIPAQUE) 300 MG/ML solution 100 mL (100 mLs Intravenous Contrast Given 05/07/18 1333)  ciprofloxacin (CIPRO) tablet 500 mg (500 mg Oral Given 05/07/18 1445)    metroNIDAZOLE (FLAGYL) tablet 500 mg (500 mg Oral Given 05/07/18 1445)  sodium chloride 0.9 % bolus 1,000 mL (1,000 mLs Intravenous New Bag/Given 05/07/18 1454)     Initial Impression / Assessment and Plan / ED Course  I have reviewed the triage vital signs and the nursing notes.  Pertinent labs & imaging results that were available during my care of the patient were reviewed by me and considered in my medical decision making (see chart for details).     Patient seen and examined. Work-up initiated. Medications ordered.   Vital signs reviewed and are as follows: BP 120/82 (BP Location: Right Arm)   Pulse (!) 136   Temp 99.9 F (37.7 C) (Oral)   Resp 20   Ht 5\' 1"  (1.549 m)  Wt 86.2 kg (190 lb)   SpO2 97%   BMI 35.90 kg/m   Lab work is overall reassuring.  CT of the abdomen and pelvis ordered given abdominal pain without elevated liver function tests.  Mild leukocytosis.  CT demonstrates ascending and transverse colitis.  Heart rate improved however patient continues to be nauseous.  We will continue hydration and antiemetics.  Will p.o. Challenge.  4:48 PM patient continues to be tachycardic in the 110-120 range.  She is actively vomiting in the room after trial of oral antibiotics.  Will give additional antiemetics.  She does not appear well enough to go home at this point.  Continues to c/o HA. Will give Reglan.  Will request admission for symptom control and hydration.  Patient and family agree.  5:17 PM Spoke with Dr. [name] who will see patient.    Final Clinical Impressions(s) / ED Diagnoses   Final diagnoses:  Colitis  Intractable vomiting with nausea, unspecified vomiting type   Patient with generalized body aches, fever to 103 at home.  Significant upper abdominal pain.  CT demonstrates a sending and transverse colitis.  We have been unable to control patient's vomiting despite IV antiemetics and hydration.  Patient will need admitted for symptom control.  ED  Discharge Orders    None       Renne Crigler, [place] 05/07/18 1718    Margarita Grizzle, MD 05/10/18 1023

## 2018-05-08 LAB — COMPREHENSIVE METABOLIC PANEL
ALT: 13 U/L (ref 0–44)
AST: 16 U/L (ref 15–41)
Albumin: 2.7 g/dL — ABNORMAL LOW (ref 3.5–5.0)
Alkaline Phosphatase: 39 U/L (ref 38–126)
Anion gap: 5 (ref 5–15)
BUN: <5 mg/dL — ABNORMAL LOW (ref 6–20)
CO2: 23 mmol/L (ref 22–32)
Calcium: 8.1 mg/dL — ABNORMAL LOW (ref 8.9–10.3)
Chloride: 110 mmol/L (ref 98–111)
Creatinine, Ser: 0.72 mg/dL (ref 0.44–1.00)
GFR calc Af Amer: >60 mL/min (ref >60)
GFR calc non Af Amer: >60 mL/min (ref >60)
Glucose, Bld: 87 mg/dL (ref 70–99)
Potassium: 3.4 mmol/L — ABNORMAL LOW (ref 3.5–5.1)
Sodium: 138 mmol/L (ref 135–145)
Total Bilirubin: 0.6 mg/dL (ref 0.3–1.2)
Total Protein: 5.4 g/dL — ABNORMAL LOW (ref 6.5–8.1)

## 2018-05-08 LAB — CBC
HCT: 34.5 % — ABNORMAL LOW (ref 36.0–46.0)
Hemoglobin: 11 g/dL — ABNORMAL LOW (ref 12.0–15.0)
MCH: 29.3 pg (ref 26.0–34.0)
MCHC: 31.9 g/dL (ref 30.0–36.0)
MCV: 91.8 fL (ref 78.0–100.0)
Platelets: 149 10*3/uL — ABNORMAL LOW (ref 150–400)
RBC: 3.76 MIL/uL — ABNORMAL LOW (ref 3.87–5.11)
RDW: 12.3 % (ref 11.5–15.5)
WBC: 8.1 10*3/uL (ref 4.0–10.5)

## 2018-05-08 LAB — PROTIME-INR
INR: 1.2
Prothrombin Time: 15.1 seconds (ref 11.4–15.2)

## 2018-05-08 MED ORDER — SODIUM CHLORIDE 0.9 % IV SOLN
3.0000 g | Freq: Four times a day (QID) | INTRAVENOUS | Status: DC
  Administered 2018-05-08 – 2018-05-11 (×13): 3 g via INTRAVENOUS
  Filled 2018-05-08 (×14): qty 3

## 2018-05-08 NOTE — Progress Notes (Signed)
Pharmacy Antibiotic Note  Alexa Horn is a 37 y.o. female admitted on 05/07/2018 with possible colitis .  Pharmacy has been consulted for Unasyn dosing. Ceftin allergy (hives) listed. Patient reports that she took Ceftin when pregnant with son (17 yrs ago), and thinks she may have had itching. She reports that she tolerates Penicillin.  Plan:  Unasyn 3 gm IV q6hrs.  Follow up tolerance, length of therapy.  Do not expect any need to adjust dosing.  Height: 5\' 1"  (154.9 cm) Weight: 190 lb 11.2 oz (86.5 kg) IBW/kg (Calculated) : 47.8  Temp (24hrs), Avg:98.8 F (37.1 C), Min:98 F (36.7 C), Max:99.6 F (37.6 C)  Recent Labs  Lab 05/07/18 1019 05/07/18 1313 05/08/18 0430  WBC 11.7*  --  8.1  CREATININE 0.91  --  0.72  LATICACIDVEN  --  0.47*  --     Estimated Creatinine Clearance: 98.1 mL/min (by C-G formula based on SCr of 0.72 mg/dL).    Allergies  Allergen Reactions  . Ceftin [Cefuroxime Axetil] Hives  . Hydrocodone-Acetaminophen Itching  . Percocet [Oxycodone-Acetaminophen] Itching  . Tape Hives  . Bioflavonoids Other (See Comments)    Advised caused yeast infections when she was younger  . Lactose Intolerance (Gi)   . Orange Fruit [Citrus]     Advised caused yeast infections when she was younger    Antimicrobials this admission:   Unasyn 7/8>>   Cipro 500 mg PO x 1 on 7/7   Flagyl 500 mg PO x 1 on 7/7  Dose adjustments this admission:  n/a  Microbiology results:  none  Thank you for allowing pharmacy to be a part of this patient's care.  Alexa Horn, Alexa Horn, [place] Pager: [phone] or phone: [phone] 05/08/2018 1:59 PM

## 2018-05-08 NOTE — Progress Notes (Signed)
[facility] PROGRESS NOTE  Alexa Horn VWU:[id number] DOB: 07/25/1981 DOA: 05/07/2018 PCP: Alexa Blase'Buch, Greta, PA-C   Narrative: 37 year old female bipolar, chronic migraine, obesity on phentermine with BMI 35 [facility] with colitis   A & Plan Colitis-continue IV antibiotics-pain is moderately better but not completely improved-do not graduate diet until seen again in a.m.-expect several days for resolution--any saline 125 cc/H-pain management Toradol and for breakthrough morphine  Chronic migraines Excedrin seems to help-is on Topamax as well-seems stable at this time  BMI 35 holding phentermine at this time-outpatient PCP to be aware of risks regarding the same  Recent foot surgery-outpatient follow-up with podiatrist  Bipolar continue Zoloft 100 daily  Splenomegaly uncertain significance  ?secondary to steatosis and fatty liver-outpatient scan as PRN-we will cc PCP-platelets are slightly low in the 140 range  Mild hypokalemia-recheck labs a.m. do not treat at this time  DVT prophylaxis: Lovenox code Status: Full code family Communication: Discussed with mother at bedside on admission disposition Plan: Expect several days for resolution   Mahala MenghiniSamtani, MD  [facility] Direct contact: [phone] --Via amion app OR  --[email or web address]; password [email or web address]  7PM-7AM contact night coverage as above 05/08/2018, 12:39 PM  LOS: 1 day   Consultants:  None  Procedures:  None  Antimicrobials:  Cipro Flagyl transition to Unasyn  Interval history/Subjective: Awake alert pleasant minimal improvement Tolerating some orals only fairly well  Objective:  Vitals:  Vitals:   05/08/18 0323 05/08/18 0740  BP: 111/73 116/72  Pulse: 80 70  Resp: 20 18  Temp: 98.3 F (36.8 C) 98 F (36.7 C)  SpO2: 97% 100%    Exam:  Awake alert obese no distress No icterus no pallor Neck soft supple Chest clinically clear S1-S2 no murmur Abdomen slightly  tender in epigastrium   I have personally reviewed the following:   Labs:  Potassium 3.4  Platelet count 149  Hemoglobin 11  Imaging studies:  Reviewed  Medical tests:  None  Test discussed with performing physician:  No  Decision to obtain old records:  Yes  Review and summation of old records:  Yes on admission  Scheduled Meds: . enoxaparin (LOVENOX) injection  40 mg Subcutaneous Q24H  . loratadine  10 mg Oral Daily  . sertraline  100 mg Oral Daily  . topiramate  50 mg Oral BID   Continuous Infusions: . sodium chloride 125 mL/hr at 05/08/18 [id number]    Active Problems:   Colitis   LOS: 1 day

## 2018-05-09 LAB — CBC
HCT: 35.4 % — ABNORMAL LOW (ref 36.0–46.0)
Hemoglobin: 11.2 g/dL — ABNORMAL LOW (ref 12.0–15.0)
MCH: 28.9 pg (ref 26.0–34.0)
MCHC: 31.6 g/dL (ref 30.0–36.0)
MCV: 91.2 fL (ref 78.0–100.0)
Platelets: 186 10*3/uL (ref 150–400)
RBC: 3.88 MIL/uL (ref 3.87–5.11)
RDW: 12.6 % (ref 11.5–15.5)
WBC: 6.9 10*3/uL (ref 4.0–10.5)

## 2018-05-09 MED ORDER — ASPIRIN-ACETAMINOPHEN-CAFFEINE 250-250-65 MG PO TABS
1.0000 | ORAL_TABLET | Freq: Four times a day (QID) | ORAL | Status: DC | PRN
  Administered 2018-05-10 – 2018-05-12 (×5): 1 via ORAL
  Filled 2018-05-09 (×9): qty 1

## 2018-05-09 MED ORDER — FAMOTIDINE IN NACL 20-0.9 MG/50ML-% IV SOLN
20.0000 mg | Freq: Two times a day (BID) | INTRAVENOUS | Status: DC
  Administered 2018-05-09 – 2018-05-11 (×5): 20 mg via INTRAVENOUS
  Filled 2018-05-09 (×5): qty 50

## 2018-05-09 NOTE — Progress Notes (Signed)
[facility] PROGRESS NOTE  Alexa Horn ZOX:[id number] DOB: 08/14/1981 DOA: 05/07/2018 PCP: Eunice Blase'Buch, Greta, PA-C   Narrative: 37 year old female bipolar, chronic migraine, obesity on phentermine with BMI 35 [facility] with colitis Developed dark stool on 7/9   A & Plan  ?  Melena-2 unformed dark stools today-note on Excedrin Migraine and we were given Toradol-explained in detail to patient will start PPI gtt. if she has another dark stool and not emergently get GI to see hemoglobin has fallen  Colitis-continue IV antibiotics-pain is moderately better has changed in character and is now in the upper quadrant-hold graduation of diet-PPI twice daily and consider GTT as above-repeat hemoglobin 1 PM-if any lower consult GI  Chronic migraines Excedrin seems to help-is on Topamax as well-seems stable at this time  BMI 35 holding phentermine at this time-outpatient PCP to be aware of risks regarding the same  Recent foot surgery-outpatient follow-up with podiatrist  Bipolar continue Zoloft 100 daily  Splenomegaly uncertain significance  ?secondary to steatosis and fatty liver-outpatient scan as PRN-we will cc PCP-platelets are slightly low in the 140 range  Mild hypokalemia-recheck labs a.m. do not treat at this time  DVT prophylaxis: Lovenox code Status: Full code family Communication: Discussed with mother at bedside on admission disposition Plan: Expect several days for resolution   Mahala MenghiniSamtani, MD  [facility] Direct contact: [phone] --Via amion app OR  --[email or web address]; password [email or web address]  7PM-7AM contact night coverage as above 05/09/2018, 10:47 AM  LOS: 2 days   Consultants:  None  Procedures:  None  Antimicrobials:  Cipro Flagyl transition to Unasyn  Interval history/Subjective:  2 dark stools-abdominal pain-tolerating clears-headaches are somewhat better but have returned-overall about the same and not much  improvement  Objective:  Vitals:  Vitals:   05/08/18 2206 05/09/18 0606  BP: 117/75 (!) 90/52  Pulse: 67 61  Resp: 18 17  Temp: 97.7 F (36.5 C) 97.8 F (36.6 C)  SpO2: 97% 98%    Exam:  Awake alert-room is dark-patient still seems to have headache No icterus no pallor Neck soft supple Chest clinically clear S1-S2 no murmur Abdomen slightly tender in epigastrium does have some radiation up in the chest  I have personally reviewed the following:   Labs:  None Drawn as yet  Imaging studies:  Reviewed  Medical tests:  None  Test discussed with performing physician:  No  Decision to obtain old records:  Yes  Review and summation of old records:  Yes on admission  Scheduled Meds: . enoxaparin (LOVENOX) injection  40 mg Subcutaneous Q24H  . loratadine  10 mg Oral Daily  . sertraline  100 mg Oral Daily  . topiramate  50 mg Oral BID   Continuous Infusions: . sodium chloride 125 mL/hr at 05/09/18 0423  . ampicillin-sulbactam (UNASYN) IV Stopped (05/09/18 [id number])    Active Problems:   Colitis   LOS: 2 days

## 2018-05-10 DIAGNOSIS — R112 Nausea with vomiting, unspecified: Secondary | ICD-10-CM

## 2018-05-10 LAB — CBC WITH DIFFERENTIAL/PLATELET
Basophils Absolute: 0.1 10*3/uL (ref 0.0–0.1)
Basophils Absolute: 0.1 10*3/uL (ref 0.0–0.1)
Basophils Absolute: 0.1 10*3/uL (ref 0.0–0.1)
Basophils Relative: 1 %
Basophils Relative: 1 %
Basophils Relative: 1 %
Eosinophils Absolute: 0.2 10*3/uL (ref 0.0–0.7)
Eosinophils Absolute: 0.1 10*3/uL (ref 0.0–0.7)
Eosinophils Absolute: 0.2 10*3/uL (ref 0.0–0.7)
Eosinophils Relative: 3 %
Eosinophils Relative: 2 %
Eosinophils Relative: 3 %
HCT: 36.7 % (ref 36.0–46.0)
HCT: 36.4 % (ref 36.0–46.0)
HCT: 36.5 % (ref 36.0–46.0)
Hemoglobin: 11.7 g/dL — ABNORMAL LOW (ref 12.0–15.0)
Hemoglobin: 11.6 g/dL — ABNORMAL LOW (ref 12.0–15.0)
Hemoglobin: 12 g/dL (ref 12.0–15.0)
Lymphocytes Relative: 41 %
Lymphocytes Relative: 41 %
Lymphocytes Relative: 45 %
Lymphs Abs: 2.8 10*3/uL (ref 0.7–4.0)
Lymphs Abs: 3 10*3/uL (ref 0.7–4.0)
Lymphs Abs: 3.4 10*3/uL (ref 0.7–4.0)
MCH: 28.7 pg (ref 26.0–34.0)
MCH: 28.6 pg (ref 26.0–34.0)
MCH: 28.9 pg (ref 26.0–34.0)
MCHC: 31.9 g/dL (ref 30.0–36.0)
MCHC: 31.9 g/dL (ref 30.0–36.0)
MCHC: 32.9 g/dL (ref 30.0–36.0)
MCV: 90.2 fL (ref 78.0–100.0)
MCV: 89.7 fL (ref 78.0–100.0)
MCV: 88 fL (ref 78.0–100.0)
Monocytes Absolute: 0.4 10*3/uL (ref 0.1–1.0)
Monocytes Absolute: 0.4 10*3/uL (ref 0.1–1.0)
Monocytes Absolute: 0.6 10*3/uL (ref 0.1–1.0)
Monocytes Relative: 6 %
Monocytes Relative: 5 %
Monocytes Relative: 8 %
Neutro Abs: 3.3 10*3/uL (ref 1.7–7.7)
Neutro Abs: 3.8 10*3/uL (ref 1.7–7.7)
Neutro Abs: 3.2 10*3/uL (ref 1.7–7.7)
Neutrophils Relative %: 49 %
Neutrophils Relative %: 51 %
Neutrophils Relative %: 43 %
Platelets: 226 10*3/uL (ref 150–400)
Platelets: 255 10*3/uL (ref 150–400)
Platelets: 249 10*3/uL (ref 150–400)
RBC: 4.07 MIL/uL (ref 3.87–5.11)
RBC: 4.06 MIL/uL (ref 3.87–5.11)
RBC: 4.15 MIL/uL (ref 3.87–5.11)
RDW: 12.1 % (ref 11.5–15.5)
RDW: 12 % (ref 11.5–15.5)
RDW: 12 % (ref 11.5–15.5)
WBC: 6.8 10*3/uL (ref 4.0–10.5)
WBC: 7.4 10*3/uL (ref 4.0–10.5)
WBC: 7.5 10*3/uL (ref 4.0–10.5)

## 2018-05-10 LAB — OCCULT BLOOD X 1 CARD TO LAB, STOOL: Fecal Occult Bld: POSITIVE — AB

## 2018-05-10 NOTE — Progress Notes (Signed)
PROGRESS NOTE  Alexa Horn ZOX:[id number] DOB: 07/06/1981 DOA: 05/07/2018 PCP: Eunice Blase'Buch, Greta, PA-C  HPI/Recap of past 24 hours: Reports intermittent abdominal cramping with nausea, no vomiting, no fever, no diarrhea  Assessment/Plan: Active Problems:   Colitis  Colitis: She presented with N/v/abdominal pain/body aches Ct abdomen with Evidence of colitis involving the ascending and transverse colon regions. She reports sick contact, her son has recent gi issues She is on abx for now, advance diet as tolerated No diarrhea since admitted  May need gi consult if symptom does not improve  Body mass index is 36.03 kg/m.   Chronic migraine headache, she is advised to follow up with a headache specialist.  Code Status: full  Family Communication: patient   Disposition Plan: home once able to tolerate diet but may need gi consult if symptom persists   Consultants:  none  Procedures:  none  Antibiotics:  As above   Objective: BP 111/73 (BP Location: Left Arm)   Pulse 83   Temp 98.9 F (37.2 C)   Resp 18   Ht 5\' 1"  (1.549 m)   Wt 86.5 kg (190 lb 11.2 oz)   SpO2 92%   BMI 36.03 kg/m  No intake or output data in the 24 hours ending 05/10/18 1806 Filed Weights   05/07/18 1019 05/07/18 1807  Weight: 86.2 kg (190 lb) 86.5 kg (190 lb 11.2 oz)    Exam: Patient is examined daily including today on 05/10/2018, exams remain the same as of yesterday except that has changed    General:  NAD  Cardiovascular: RRR  Respiratory: CTABL  Abdomen: left lower quadrant tenderness, no guarding, no rebound, positive BS  Musculoskeletal: No Edema  Neuro: alert, oriented   Data Reviewed: Basic Metabolic Panel: Recent Labs  Lab 05/07/18 1019 05/08/18 0430  NA 135 138  K 3.5 3.4*  CL 103 110  CO2 25 23  GLUCOSE 116* 87  BUN 6 <5*  CREATININE 0.91 0.72  CALCIUM 8.6* 8.1*   Liver Function Tests: Recent Labs  Lab 05/07/18 1019 05/08/18 0430  AST 22 16  ALT 18  13  ALKPHOS 64 39  BILITOT 0.5 0.6  PROT 7.3 5.4*  ALBUMIN 3.7 2.7*   Recent Labs  Lab 05/07/18 1019  LIPASE 35   No results for input(s): AMMONIA in the last 168 hours. CBC: Recent Labs  Lab 05/07/18 1019 05/08/18 0430 05/09/18 1435 05/10/18 1210  WBC 11.7* 8.1 6.9 6.8  NEUTROABS  --   --   --  3.3  HGB 14.3 11.0* 11.2* 11.7*  HCT 44.0 34.5* 35.4* 36.7  MCV 90.9 91.8 91.2 90.2  PLT 219 149* 186 226   Cardiac Enzymes:   No results for input(s): CKTOTAL, CKMB, CKMBINDEX, TROPONINI in the last 168 hours. BNP (last 3 results) No results for input(s): BNP in the last 8760 hours.  ProBNP (last 3 results) No results for input(s): PROBNP in the last 8760 hours.  CBG: No results for input(s): GLUCAP in the last 168 hours.  No results found for this or any previous visit (from the past 240 hour(s)).   Studies: No results found.  Scheduled Meds: . enoxaparin (LOVENOX) injection  40 mg Subcutaneous Q24H  . loratadine  10 mg Oral Daily  . sertraline  100 mg Oral Daily  . topiramate  50 mg Oral BID    Continuous Infusions: . sodium chloride 75 mL/hr at 05/10/18 0645  . ampicillin-sulbactam (UNASYN) IV 3 g (05/10/18 1334)  . famotidine (  PEPCID) IV 20 mg (05/10/18 1055)     Time spent: I have personally reviewed and interpreted on  05/10/2018 daily labs, imagings as discussed above under date review session and assessment and plans.  I reviewed all nursing notes, vitals, pertinent old records  I have discussed plan of care as described above with RN , patient  on 05/10/2018   Albertine Grates MD, PhD  [facility] Pager [phone]. If 7PM-7AM, please contact night-coverage at [email or web address], password [facility] 05/10/2018, 6:06 PM  LOS: 3 days

## 2018-05-11 ENCOUNTER — Encounter (HOSPITAL_COMMUNITY): Payer: Self-pay | Admitting: Physician Assistant

## 2018-05-11 DIAGNOSIS — K529 Noninfective gastroenteritis and colitis, unspecified: Secondary | ICD-10-CM

## 2018-05-11 DIAGNOSIS — A09 Infectious gastroenteritis and colitis, unspecified: Secondary | ICD-10-CM

## 2018-05-11 DIAGNOSIS — A045 Campylobacter enteritis: Principal | ICD-10-CM

## 2018-05-11 LAB — COMPREHENSIVE METABOLIC PANEL
ALT: 16 U/L (ref 0–44)
AST: 22 U/L (ref 15–41)
Albumin: 3 g/dL — ABNORMAL LOW (ref 3.5–5.0)
Alkaline Phosphatase: 48 U/L (ref 38–126)
Anion gap: 7 (ref 5–15)
BUN: 6 mg/dL (ref 6–20)
CO2: 26 mmol/L (ref 22–32)
Calcium: 8.7 mg/dL — ABNORMAL LOW (ref 8.9–10.3)
Chloride: 107 mmol/L (ref 98–111)
Creatinine, Ser: 0.88 mg/dL (ref 0.44–1.00)
GFR calc Af Amer: >60 mL/min (ref >60)
GFR calc non Af Amer: >60 mL/min (ref >60)
Glucose, Bld: 97 mg/dL (ref 70–99)
Potassium: 4 mmol/L (ref 3.5–5.1)
Sodium: 140 mmol/L (ref 135–145)
Total Bilirubin: 0.3 mg/dL (ref 0.3–1.2)
Total Protein: 5.8 g/dL — ABNORMAL LOW (ref 6.5–8.1)

## 2018-05-11 LAB — GASTROINTESTINAL PANEL BY PCR, STOOL (REPLACES STOOL CULTURE)

## 2018-05-11 LAB — MAGNESIUM: Magnesium: 2.1 mg/dL (ref 1.7–2.4)

## 2018-05-11 MED ORDER — FAMOTIDINE 20 MG PO TABS
20.0000 mg | ORAL_TABLET | Freq: Two times a day (BID) | ORAL | Status: DC
  Administered 2018-05-11 – 2018-05-12 (×3): 20 mg via ORAL
  Filled 2018-05-11 (×3): qty 1

## 2018-05-11 MED ORDER — AZITHROMYCIN 500 MG PO TABS
500.0000 mg | ORAL_TABLET | Freq: Every day | ORAL | Status: AC
  Administered 2018-05-11 – 2018-05-13 (×3): 500 mg via ORAL
  Filled 2018-05-11 (×3): qty 1

## 2018-05-11 NOTE — Consult Note (Addendum)
[facility] Consult: 3:12 PM 05/11/2018  LOS: 4 days   Alexa Horn.   Referring Provider: Dr Roda Horn  Primary Care Physician:  Alexa Blase, PA-C Primary Gastroenterologist:  Alexa Horn.  Several years since last seen.  Unable to find any of the past records in archive.      Reason for Consultation:  blood in stool and abd pain.  Colitis per CT   HPI: Alexa Horn is a 37 y.o. female.  Hx obesity, on Phentermine.  Bipolar disorder.  12/2017 right ankle surgery.  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 At [facility].  Normal study. EGD in past.   Patient has not had a flare of her pancreatitis since at least 2010.  She recalls having had 3 or 4 episodes beginning 2004.  She attributes pancreatitis to use of antihistamines, spicy foods and avoids these.  She also avoids excessive alcohol though she was never a heavy drinker. On Thursday, Independence Day, she ate and drank the same thing some friends as well as her husband none of whom have subsequently become ill.  She felt sweaty and feverish. Starting on Friday she developed malaise, anorexia and aches and pains all over her body.  Her skin was sensitive to the touch.  She had pain in the epigastrium which felt a bit like prior bouts of pancreatitis.  She was having greenish colored liquid diarrhea that eventually turned bloody.  At its peak over the weekend she was having anywhere from 15-20 bowel movements in a 24-hour period.  She was vomiting but this was bilious.  On Monday she came to the hospital and was admitted.  She had no fever.  Initial WBC count 11.7 but normalized within 18 hours.  Platelets also dipped as low as 149 but have since normalized. CMET, H/H, coags, Lipase, U/A unremarkable.     FOBT +. GI path panel  processing.   Ct ab/pelvis with contrast: ascending, transverse colitis with associated mild RLQ adenopathy.  Prominent spleen.  Clips c/w tubal ligation.   Currently being treated with empiric Unasyn. Patient still has upper abdominal discomfort but the diarrhea, which is dark in color and leaching out a redness into the commode water, has formed up to a soft stool and she is only had 2 of these in the last 24 hours.  No further nausea or vomiting.  Tolerating full liquid diet but does not have much of an appetite.  Works at [employer], Data processing manager.  Diligent re hand hygiene.   Drinks at most a couple of glasses of wine or a couple of beers in a week's time.  Takes Excedrin as needed for "cluster migraines".  She is probably used 6 tablets within the last 3 weeks. No new medications.  No issues with dizziness or dehydration that she can recall.  Family history negative for colon cancer, peptic ulcer disease, Crohn's disease, ulcerative colitis.  Mother has undergone partial colon resection for problems with diverticulitis.  Mother also has a "spastic" esophagus    Past Medical  History:  Diagnosis Date  . ADHD (attention deficit hyperactivity disorder)   . History of migraine headaches   . Pancreatitis 2004   Idiopathic? dx when she was pregnant, she gets flareups when she eats tomato and decongestant, was seen by Alexa Chales Horn in [place]  . Urticaria     Past Surgical History:  Procedure Laterality Date  . ADENOIDECTOMY    . TONSILLECTOMY AND ADENOIDECTOMY  ~ 1996  . TUBAL LIGATION    . UMBILICAL HERNIA REPAIR  ~ 2004  . UPPER GASTROINTESTINAL ENDOSCOPY     between 2004 and 2010.  Alexa Chales Horn.  No records found in [facility] Archive.    Prior to Admission medications   Medication Sig Start Date End Date Taking? Authorizing Provider  aspirin-acetaminophen-caffeine (EXCEDRIN MIGRAINE) [phone] MG tablet Take 2 tablets by mouth every 6 (six) hours as needed for headache.    Yes [provider]  cetirizine (ZYRTEC) 10 MG tablet Take 10 mg by mouth at bedtime.   Yes [provider]  diphenhydrAMINE (BENADRYL) 25 mg capsule Take 1 capsule (25 mg total) by mouth every 6 (six) hours as needed. 03/09/17  Yes Alexa Pili, PA-C  EPINEPHrine (EPIPEN 2-PAK) 0.3 mg/0.3 mL IJ SOAJ injection Inject 0.3 mLs (0.3 mg total) into the skin as needed. 05/12/17  Yes Kozlow, Alvira Philips, MD  phentermine (ADIPEX-P) 37.5 MG tablet Take 37.5 mg by mouth daily.  09/26/17  Yes [provider]  sertraline (ZOLOFT) 100 MG tablet Take 100 mg by mouth daily. 04/26/18  Yes [provider]  topiramate (TOPAMAX) 50 MG tablet Take 50 mg by mouth 2 (two) times daily.  09/19/17  Yes [provider]  naproxen (NAPROSYN) 375 MG tablet Take 1 tablet (375 mg total) by mouth 2 (two) times daily with a meal. Patient not taking: Reported on 05/07/2018 10/11/17   Everlene Farrier, PA-C    Scheduled Meds: . enoxaparin (LOVENOX) injection  40 mg Subcutaneous Q24H  . famotidine  20 mg Oral BID  . loratadine  10 mg Oral Daily  . sertraline  100 mg Oral Daily  . topiramate  50 mg Oral BID   Infusions: . sodium chloride 75 mL/hr at 05/10/18 2304  . ampicillin-sulbactam (UNASYN) IV 3 g (05/11/18 1357)   PRN Meds: aspirin-acetaminophen-caffeine, morphine injection, promethazine   Allergies as of 05/07/2018 - Review Complete 05/07/2018  Allergen Reaction Noted  . Ceftin [cefuroxime axetil] Hives 11/06/2013  . Hydrocodone-acetaminophen Itching 01/23/2015  . Percocet [oxycodone-acetaminophen] Itching 10/17/2017  . Tape Hives 12/07/2017  . Bioflavonoids Other (See Comments) 03/11/2017  . Lactose intolerance (gi)  03/11/2017  . Orange fruit [citrus]  03/11/2017    Family History  Problem Relation Age of Onset  . Breast cancer Maternal Aunt   . Breast cancer Maternal Grandmother   . Heart disease Maternal Grandfather   . Hypercholesterolemia Maternal Grandfather   .  Hypertension Maternal Grandfather     Social History   Socioeconomic History  . Marital status: Married    Spouse name: Not on file  . Number of children: Not on file  . Years of education: Not on file  . Highest education level: Not on file  Occupational History  . 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    Social Needs  . Financial resource strain: Not on file  . Food insecurity:    Worry: Not on file    Inability: Not on file  . Transportation needs:  Medical: Not on file    Non-medical: Not on file  Tobacco Use  . Smoking status: Never Smoker  . Smokeless tobacco: Never Used  Substance and Sexual Activity  . Alcohol use: Yes    Comment: couple of glasses of wine or beer less than every week.    . Drug use: No  . Sexual activity: Yes    Birth control/protection: Surgical    Comment: s/p tubal ligation  Lifestyle  . Physical activity:    Days per week: Not on file    Minutes per session: Not on file  . Stress: Not on file  Relationships  . Social connections:    Talks on phone: Not on file    Gets together: Not on file    Attends religious service: Not on file    Active member of club or organization: Not on file    Attends meetings of clubs or organizations: Not on file    Relationship status: Not on file  . Intimate partner violence:    Fear of current or ex partner: Not on file    Emotionally abused: Not on file    Physically abused: Not on file    Forced sexual activity: Not on file  Other Topics Concern  . Not on file  Social History Narrative  . Not on file    REVIEW OF SYSTEMS: Constitutional: Weakness, malaise improved but not resolved. ENT:  No nose bleeds Pulm: No cough.  No shortness of breath.  He CV:  No palpitations, no pain.Yetta Flock. Develops dependent edema in her right ankle where she had surgery when she stands for prolonged periods of time. GU:  No hematuria, no frequency GI: Before the acute illness patient was  not having any significant GI symptoms. Heme: No unusual bleeding or bruising other than seeing the blood in the stool. Transfusions: None Neuro:  No headaches, no peripheral tingling or numbness currently. Derm: The painful skin sensitivity has resolved. Endocrine:  No sweats or chills.  No polyuria or dysuria Immunization: Not queried. Travel:  None beyond local counties in last few months.    PHYSICAL EXAM: Vital signs in last 24 hours: Vitals:   05/11/18 0425 05/11/18 1445  BP: 105/70 124/77  Pulse: (!) 54 74  Resp: 17 18  Temp: 98.1 F (36.7 C) 97.8 F (36.6 C)  SpO2: 98% 100%   Wt Readings from Last 3 Encounters:  05/07/18 190 lb 11.2 oz (86.5 kg)  04/21/17 206 lb (93.4 kg)  03/08/17 208 lb (94.3 kg)    General: Pleasant, well-appearing, comfortable, alert WF. Head: No facial asymmetry or swelling.  No signs of head trauma. Eyes: No conjunctival pallor or scleral icterus.  EOMI. Ears: Not HOH Nose: No congestion or discharge. Mouth: Moist, pink, clear oral mucosa.  Tongue midline.  Good dentition. Neck: No JVD, masses, thyromegaly. Lungs: Clear bilaterally.  No labored breathing or cough. Heart: RRR.  No MRG.  S1, S2 present Abdomen: Soft.  Tender throughout without guarding.  Active, normal quality, bowel sounds..   Rectal: Deferred rectal exam.  Stool observed sitting at the bottom of the commode.  This is soft, unformed and very dark brown/black in color.  There is red water leaching out from the edges of the stool. Musc/Skeltl: No joint swelling, redness or significant deformity. Extremities: No CCE. Neurologic: Alert.  Oriented x3.  Moves all 4 limbs, strength not tested.  No tremors.  No gross neurologic deficits. Skin: No rashes, sores, telangiectasia. Nodes: No cervical adenopathy.  Psych: Calm, pleasant, cooperative.  Intake/Output from previous day: No intake/output data recorded. Intake/Output this shift: Total I/O In: 220 [P.O.:220] Out: -   LAB  RESULTS: Recent Labs    05/10/18 1210 05/10/18 1923 05/10/18 2207  WBC 6.8 7.4 7.5  HGB 11.7* 11.6* 12.0  HCT 36.7 36.4 36.5  PLT 226 255 249   BMET Lab Results  Component Value Date   NA 140 05/11/2018   NA 138 05/08/2018   NA 135 05/07/2018   K 4.0 05/11/2018   K 3.4 (L) 05/08/2018   K 3.5 05/07/2018   CL 107 05/11/2018   CL 110 05/08/2018   CL 103 05/07/2018   CO2 26 05/11/2018   CO2 23 05/08/2018   CO2 25 05/07/2018   GLUCOSE 97 05/11/2018   GLUCOSE 87 05/08/2018   GLUCOSE 116 (H) 05/07/2018   BUN 6 05/11/2018   BUN <5 (L) 05/08/2018   BUN 6 05/07/2018   CREATININE 0.88 05/11/2018   CREATININE 0.72 05/08/2018   CREATININE 0.91 05/07/2018   CALCIUM 8.7 (L) 05/11/2018   CALCIUM 8.1 (L) 05/08/2018   CALCIUM 8.6 (L) 05/07/2018   LFT Recent Labs    05/11/18 0731  PROT 5.8*  ALBUMIN 3.0*  AST 22  ALT 16  ALKPHOS 48  BILITOT 0.3   PT/INR Lab Results  Component Value Date   INR 1.20 05/08/2018   Hepatitis Panel No results for input(s): HEPBSAG, HCVAB, HEPAIGM, HEPBIGM in the last 72 hours. C-Diff No components found for: CDIFF Lipase     Component Value Date/Time   LIPASE 35 05/07/2018 1019    Drugs of Abuse  No results found for: LABOPIA, COCAINSCRNUR, LABBENZ, AMPHETMU, THCU, LABBARB   RADIOLOGY STUDIES: No results found.    IMPRESSION:   *  Acute colitis involving the a sending and transverse colon.  Transient elevation of WBCs, resolved. Pattern of distribution not typical for ischemic.  Her systemic symptoms of malaise, skin sensitivity, overall body aches and pains suggest infectious etiology.  Day 4 Unasyn, has made some improvement.  *    History of what sounds like idiopathic pancreatitis 2004.  No evidence that she currently has pancreatitis per CT and normal lipase.    PLAN:     *   Alexa Lavon Paganini will see patient later today.  Not clear she requires colonoscopy unless she fails to further improve.   Unfortunately because it is  the right side of her colon which is involved, do not think that flexible sigmoidoscopy would be an adequate diagnostic study.    Addendum: stool path panel just back:  Positive for campylobacter.  Per Up to Date, treatment is azithromycin 500 mg q day x 3 days vs 1 gm in single dose (can lead to GI upset) or until signs/sxs improve (which they have so would dose for 3 days, starting now) for severe disease which includes bloody stool.  Jennye Moccasin  05/11/2018, 3:12 PM Phone [phone]   Attending physician's note   I have taken a history, examined the patient and reviewed the chart. I agree with the Advanced Practitioner's note, impression and recommendations. 37 year old female with acute diarrhea and blood per rectum.  CT showed findings of acute colitis in ascending and transverse colon.  GI pathogen panel positive for Campylobacter. Given the severity of her disease, patient will likely need 7 day course of antibiotics (azithromycin or ciprofloxacin).   Continue supportive care Advance diet as tolerated We will arrange for follow-up with Alexa Horn as  outpatient in 4 to 6 weeks Will sign off, available if have any questions  K. Scherry Ran , MD [phone]

## 2018-05-11 NOTE — Plan of Care (Signed)
Patient making progress toward discharge goal, will continue to monitor

## 2018-05-11 NOTE — Progress Notes (Addendum)
PROGRESS NOTE  Alexa Horn ZOX:[id number] DOB: 06/18/1981 DOA: 05/07/2018 PCP: Eunice Blase'Buch, Greta, PA-C  HPI/Recap of past 24 hours:  Reports intermittent epigastric pain and right upper quadrant abdominal cramping with nausea,  Stool is still pasty with blood in it no vomiting, no fever,   Assessment/Plan: Active Problems:   Colitis  Colitis: -She presented with N/v/abdominal pain/body aches -Ct abdomen with Evidence of colitis involving the ascending and transverse colon regions. -She reports sick contact, her son has recent gi issues -She is on abx since admission, stool sample collected yesterday for gi pcr panel -persistent symptom, with epigastric pain, right upper quadrant abdominal pain, blood in stool (see pic below), hgb stable, wbc wnl - case discussed with [facility] Dr Levora AngelBrahmbhatt who graciously accepted the consult and will see patient today  Addendum: Per [facility] Dr Levora AngelBrahmbhatt patient used to see Dr. Chales AbrahamsGupta who is [facility]  [facility] consulted instead.  H/o gastritis, h/p pancreatitis: Lipase wnl She can tolerate po now, will change iv pepcid to po.  Body mass index is 36.03 kg/m.   Chronic migraine headache, she is advised to follow up with a headache specialist.  Code Status: full  Family Communication: patient   Disposition Plan: home once able to tolerate diet but may need gi consult if symptom persists   Consultants:  GI  Procedures:  none  Antibiotics:  As above   Objective: BP 105/70   Pulse (!) 54   Temp 98.1 F (36.7 C)   Resp 17   Ht 5\' 1"  (1.549 m)   Wt 86.5 kg (190 lb 11.2 oz)   SpO2 98%   BMI 36.03 kg/m   Intake/Output Summary (Last 24 hours) at 05/11/2018 1356 Last data filed at 05/11/2018 1030 Gross per 24 hour  Intake 220 ml  Output -  Net 220 ml   Filed Weights   05/07/18 1019 05/07/18 1807  Weight: 86.2 kg (190 lb) 86.5 kg (190 lb 11.2 oz)    Exam: Patient is examined daily including today on 05/11/2018,  exams remain the same as of yesterday except that has changed    General:  NAD  Cardiovascular: RRR  Respiratory: CTABL  Abdomen: epigastric tenderness with guarding, right upper quadrant tenderness, no rebound, positive BS  Musculoskeletal: No Edema  Neuro: alert, oriented      Data Reviewed: Basic Metabolic Panel: Recent Labs  Lab 05/07/18 1019 05/08/18 0430 05/11/18 0731  NA 135 138 140  K 3.5 3.4* 4.0  CL 103 110 107  CO2 25 23 26   GLUCOSE 116* 87 97  BUN 6 <5* 6  CREATININE 0.91 0.72 0.88  CALCIUM 8.6* 8.1* 8.7*  MG  --   --  2.1   Liver Function Tests: Recent Labs  Lab 05/07/18 1019 05/08/18 0430 05/11/18 0731  AST 22 16 22   ALT 18 13 16   ALKPHOS 64 39 48  BILITOT 0.5 0.6 0.3  PROT 7.3 5.4* 5.8*  ALBUMIN 3.7 2.7* 3.0*   Recent Labs  Lab 05/07/18 1019  LIPASE 35   No results for input(s): AMMONIA in the last 168 hours. CBC: Recent Labs  Lab 05/08/18 0430 05/09/18 1435 05/10/18 1210 05/10/18 1923 05/10/18 2207  WBC 8.1 6.9 6.8 7.4 7.5  NEUTROABS  --   --  3.3 3.8 3.2  HGB 11.0* 11.2* 11.7* 11.6* 12.0  HCT 34.5* 35.4* 36.7 36.4 36.5  MCV 91.8 91.2 90.2 89.7 88.0  PLT 149* 186 226 255 249   Cardiac Enzymes:  No results for input(s): CKTOTAL, CKMB, CKMBINDEX, TROPONINI in the last 168 hours. BNP (last 3 results) No results for input(s): BNP in the last 8760 hours.  ProBNP (last 3 results) No results for input(s): PROBNP in the last 8760 hours.  CBG: No results for input(s): GLUCAP in the last 168 hours.  No results found for this or any previous visit (from the past 240 hour(s)).   Studies: No results found.  Scheduled Meds: . enoxaparin (LOVENOX) injection  40 mg Subcutaneous Q24H  . loratadine  10 mg Oral Daily  . sertraline  100 mg Oral Daily  . topiramate  50 mg Oral BID    Continuous Infusions: . sodium chloride 75 mL/hr at 05/10/18 2304  . ampicillin-sulbactam (UNASYN) IV Stopped (05/11/18 0855)  . famotidine  (PEPCID) IV 20 mg (05/11/18 0857)     Time spent: I have personally reviewed and interpreted on  05/11/2018 daily labs, imagings as discussed above under date review session and assessment and plans.  I reviewed all nursing notes, vitals, pertinent old records  I have discussed plan of care as described above with RN , patient  on 05/11/2018   Albertine Grates MD, PhD  [facility] Pager [phone]. If 7PM-7AM, please contact night-coverage at [email or web address], password [facility] 05/11/2018, 1:56 PM  LOS: 4 days

## 2018-05-12 ENCOUNTER — Encounter: Payer: Self-pay | Admitting: Gastroenterology

## 2018-05-12 LAB — CBC
HCT: 37.8 % (ref 36.0–46.0)
Hemoglobin: 12 g/dL (ref 12.0–15.0)
MCH: 28.4 pg (ref 26.0–34.0)
MCHC: 31.7 g/dL (ref 30.0–36.0)
MCV: 89.4 fL (ref 78.0–100.0)
Platelets: 283 10*3/uL (ref 150–400)
RBC: 4.23 MIL/uL (ref 3.87–5.11)
RDW: 12.1 % (ref 11.5–15.5)
WBC: 9.3 10*3/uL (ref 4.0–10.5)

## 2018-05-12 LAB — BASIC METABOLIC PANEL
Anion gap: 10 (ref 5–15)
BUN: 8 mg/dL (ref 6–20)
CO2: 28 mmol/L (ref 22–32)
Calcium: 9.1 mg/dL (ref 8.9–10.3)
Chloride: 103 mmol/L (ref 98–111)
Creatinine, Ser: 0.94 mg/dL (ref 0.44–1.00)
GFR calc Af Amer: >60 mL/min (ref >60)
GFR calc non Af Amer: >60 mL/min (ref >60)
Glucose, Bld: 99 mg/dL (ref 70–99)
Potassium: 4.3 mmol/L (ref 3.5–5.1)
Sodium: 141 mmol/L (ref 135–145)

## 2018-05-12 LAB — MAGNESIUM: Magnesium: 2.2 mg/dL (ref 1.7–2.4)

## 2018-05-12 MED ORDER — PANTOPRAZOLE SODIUM 40 MG PO TBEC
40.0000 mg | DELAYED_RELEASE_TABLET | Freq: Every day | ORAL | Status: DC
  Administered 2018-05-12 – 2018-05-13 (×2): 40 mg via ORAL
  Filled 2018-05-12 (×2): qty 1

## 2018-05-12 MED ORDER — DIPHENHYDRAMINE HCL 25 MG PO CAPS
25.0000 mg | ORAL_CAPSULE | Freq: Three times a day (TID) | ORAL | Status: DC | PRN
  Administered 2018-05-12 – 2018-05-13 (×2): 25 mg via ORAL
  Filled 2018-05-12 (×2): qty 1

## 2018-05-12 NOTE — Progress Notes (Signed)
PROGRESS NOTE  Alexa Horn ZOX:[id number] DOB: 03/16/1981 DOA: 05/07/2018 PCP: Eunice Blase'Buch, Greta, PA-C  HPI/Recap of past 24 hours:  Not able to tolerated diet advancement  Reports intermittent epigastric pain and right upper quadrant abdominal cramping with nausea,  Stool is still pasty with blood in it no vomiting, no fever,   Assessment/Plan: Active Problems:   Colitis  Colitis: -She presented with N/v/abdominal pain/body aches -Ct abdomen with Evidence of colitis involving the ascending and transverse colon regions. -She reports sick contact, her son has recent gi issues -She is on unasyn since admission, stool sample collected on 7/11 + campylobacter, abx changed to zithromax -persistent symptom, with epigastric pain, right upper quadrant abdominal pain, blood in stool (see pic below), hgb stable, wbc wnl -not able to advance diet, continue ivf, continue full liquid for now - GI consulted   H/o gastritis, h/p pancreatitis: Lipase wnl Was on pepcid, change to protonix   Body mass index is 36.03 kg/m.   Chronic migraine headache, she is advised to follow up with a headache specialist. She does not want to take topamax anymore, she reports it does not work  Code Status: full  Family Communication: patient   Disposition Plan: home once able to tolerate diet advancement    Consultants:  LBGI  Procedures:  none  Antibiotics:  As above   Objective: BP 101/64 (BP Location: Right Arm)   Pulse (!) 56   Temp 97.9 F (36.6 C)   Resp 18   Ht 5\' 1"  (1.549 m)   Wt 86.5 kg (190 lb 11.2 oz)   SpO2 98%   BMI 36.03 kg/m  No intake or output data in the 24 hours ending 05/12/18 1423 Filed Weights   05/07/18 1019 05/07/18 1807  Weight: 86.2 kg (190 lb) 86.5 kg (190 lb 11.2 oz)    Exam: Patient is examined daily including today on 05/12/2018, exams remain the same as of yesterday except that has changed    General:  NAD  Cardiovascular: RRR  Respiratory:  CTABL  Abdomen: epigastric tenderness with guarding, right upper quadrant tenderness, no rebound, positive BS  Musculoskeletal: No Edema  Neuro: alert, oriented  pic from 7/11    Data Reviewed: Basic Metabolic Panel: Recent Labs  Lab 05/07/18 1019 05/08/18 0430 05/11/18 0731 05/12/18 0421  NA 135 138 140 141  K 3.5 3.4* 4.0 4.3  CL 103 110 107 103  CO2 25 23 26 28   GLUCOSE 116* 87 97 99  BUN 6 <5* 6 8  CREATININE 0.91 0.72 0.88 0.94  CALCIUM 8.6* 8.1* 8.7* 9.1  MG  --   --  2.1 2.2   Liver Function Tests: Recent Labs  Lab 05/07/18 1019 05/08/18 0430 05/11/18 0731  AST 22 16 22   ALT 18 13 16   ALKPHOS 64 39 48  BILITOT 0.5 0.6 0.3  PROT 7.3 5.4* 5.8*  ALBUMIN 3.7 2.7* 3.0*   Recent Labs  Lab 05/07/18 1019  LIPASE 35   No results for input(s): AMMONIA in the last 168 hours. CBC: Recent Labs  Lab 05/09/18 1435 05/10/18 1210 05/10/18 1923 05/10/18 2207 05/12/18 0421  WBC 6.9 6.8 7.4 7.5 9.3  NEUTROABS  --  3.3 3.8 3.2  --   HGB 11.2* 11.7* 11.6* 12.0 12.0  HCT 35.4* 36.7 36.4 36.5 37.8  MCV 91.2 90.2 89.7 88.0 89.4  PLT 186 226 255 249 283   Cardiac Enzymes:   No results for input(s): CKTOTAL, CKMB, CKMBINDEX, TROPONINI in the last  168 hours. BNP (last 3 results) No results for input(s): BNP in the last 8760 hours.  ProBNP (last 3 results) No results for input(s): PROBNP in the last 8760 hours.  CBG: No results for input(s): GLUCAP in the last 168 hours.  Recent Results (from the past 240 hour(s))  Gastrointestinal Panel by PCR , Stool     Status: Abnormal   Collection Time: 05/10/18 10:02 PM  Result Value Ref Range Status   Campylobacter species DETECTED (A) NOT DETECTED Final    Comment: RESULT CALLED TO, READ BACK BY AND VERIFIED WITH: TIM SANTIAGO 05/11/18 1452 KLW    Plesimonas shigelloides NOT DETECTED NOT DETECTED Final   Salmonella species NOT DETECTED NOT DETECTED Final   Yersinia enterocolitica NOT DETECTED NOT DETECTED Final    Vibrio species NOT DETECTED NOT DETECTED Final   Vibrio cholerae NOT DETECTED NOT DETECTED Final   Enteroaggregative E coli (EAEC) NOT DETECTED NOT DETECTED Final   Enteropathogenic E coli (EPEC) NOT DETECTED NOT DETECTED Final   Enterotoxigenic E coli (ETEC) NOT DETECTED NOT DETECTED Final   Shiga like toxin producing E coli (STEC) NOT DETECTED NOT DETECTED Final   Shigella/Enteroinvasive E coli (EIEC) NOT DETECTED NOT DETECTED Final   Cryptosporidium NOT DETECTED NOT DETECTED Final   Cyclospora cayetanensis NOT DETECTED NOT DETECTED Final   Entamoeba histolytica NOT DETECTED NOT DETECTED Final   Giardia lamblia NOT DETECTED NOT DETECTED Final   Adenovirus F40/41 NOT DETECTED NOT DETECTED Final   Astrovirus NOT DETECTED NOT DETECTED Final   Norovirus GI/GII NOT DETECTED NOT DETECTED Final   Rotavirus A NOT DETECTED NOT DETECTED Final   Sapovirus (I, II, IV, and V) NOT DETECTED NOT DETECTED Final    Comment: Performed at [facility], [address] [place], [place] [address]     Studies: No results found.  Scheduled Meds: . azithromycin  500 mg Oral Daily  . enoxaparin (LOVENOX) injection  40 mg Subcutaneous Q24H  . famotidine  20 mg Oral BID  . loratadine  10 mg Oral Daily  . sertraline  100 mg Oral Daily    Continuous Infusions: . sodium chloride 75 mL/hr at 05/12/18 5366     Time spent: I have personally reviewed and interpreted on  05/12/2018 daily labs, imagings as discussed above under date review session and assessment and plans.  I reviewed all nursing notes, vitals, pertinent old records  I have discussed plan of care as described above with RN , patient  on 05/12/2018   Albertine Grates MD, PhD  [facility] Pager [phone]. If 7PM-7AM, please contact night-coverage at [email or web address], password [facility] 05/12/2018, 2:23 PM  LOS: 5 days

## 2018-05-13 DIAGNOSIS — G44009 Cluster headache syndrome, unspecified, not intractable: Secondary | ICD-10-CM

## 2018-05-13 LAB — BASIC METABOLIC PANEL
Anion gap: 7 (ref 5–15)
BUN: 8 mg/dL (ref 6–20)
CO2: 27 mmol/L (ref 22–32)
Calcium: 8.8 mg/dL — ABNORMAL LOW (ref 8.9–10.3)
Chloride: 105 mmol/L (ref 98–111)
Creatinine, Ser: 0.87 mg/dL (ref 0.44–1.00)
GFR calc Af Amer: >60 mL/min (ref >60)
GFR calc non Af Amer: >60 mL/min (ref >60)
Glucose, Bld: 87 mg/dL (ref 70–99)
Potassium: 3.7 mmol/L (ref 3.5–5.1)
Sodium: 139 mmol/L (ref 135–145)

## 2018-05-13 LAB — CBC
HCT: 38.3 % (ref 36.0–46.0)
Hemoglobin: 12.3 g/dL (ref 12.0–15.0)
MCH: 28.9 pg (ref 26.0–34.0)
MCHC: 32.1 g/dL (ref 30.0–36.0)
MCV: 89.9 fL (ref 78.0–100.0)
Platelets: 296 10*3/uL (ref 150–400)
RBC: 4.26 MIL/uL (ref 3.87–5.11)
RDW: 12.2 % (ref 11.5–15.5)
WBC: 10.7 10*3/uL — ABNORMAL HIGH (ref 4.0–10.5)

## 2018-05-13 LAB — MAGNESIUM: Magnesium: 2 mg/dL (ref 1.7–2.4)

## 2018-05-13 MED ORDER — EPINEPHRINE 0.3 MG/0.3ML IJ SOAJ: 0.3000 mg | INTRAMUSCULAR | 1 refills | Status: DC | PRN

## 2018-05-13 MED ORDER — CIPROFLOXACIN HCL 500 MG PO TABS: 500.0000 mg | ORAL_TABLET | Freq: Two times a day (BID) | ORAL | 0 refills | Status: AC

## 2018-05-13 MED ORDER — METRONIDAZOLE 500 MG PO TABS: 500.0000 mg | ORAL_TABLET | Freq: Three times a day (TID) | ORAL | 0 refills | Status: AC

## 2018-05-13 NOTE — Progress Notes (Signed)
Hale DroneAnnie B Vines to be D/C'd Home per MD order.  Discussed with the patient and all questions fully answered.  VSS, Skin clean, dry and intact without evidence of skin break down, no evidence of skin tears noted. IV catheter discontinued intact. Site without signs and symptoms of complications. Dressing and pressure applied.  An After Visit Summary was printed and given to the patient. Patient received prescription.  D/c education completed with patient/family including follow up instructions, medication list, d/c activities limitations if indicated, with other d/c instructions as indicated by MD - patient able to verbalize understanding, all questions fully answered.   Patient instructed to return to ED, call 911, or call MD for any changes in condition.   Patient escorted via WC, and D/C home via private auto.  Caren HazyKamila A Markita Stcharles 05/13/2018 1:33 PM
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
## 2018-06-19 IMAGING — CT CT HEAD W/O CM
3 of 4 series · 15 of 47 positions shown, 18 images · non-contrast
Comparison: 07/11/2013

CLINICAL DATA: Headache.  Allergic reaction.

EXAM:
CT HEAD WITHOUT CONTRAST
TECHNIQUE: Contiguous axial images were obtained from the base of the skull
through the vertex without intravenous contrast.

[Series 2: head w/o · axial · non-contrast · 0.41mm/px · z∈[-83,+42]mm · 9 of 31 slices shown, 12 images]
[im 3/31  brain]
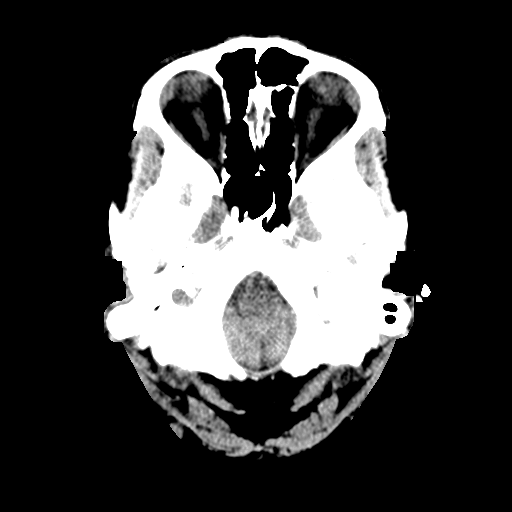
[im 3/31  bone]
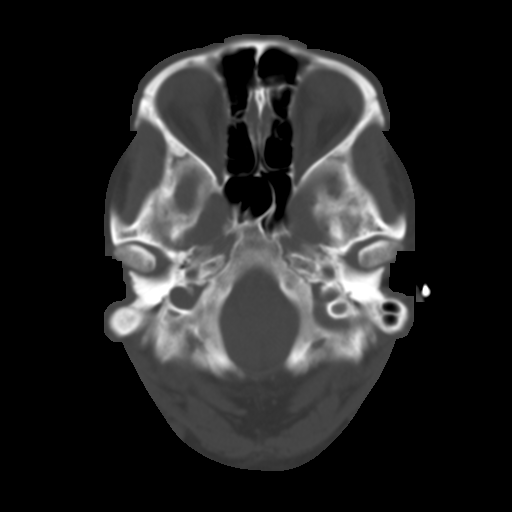
[im 7/31  brain]
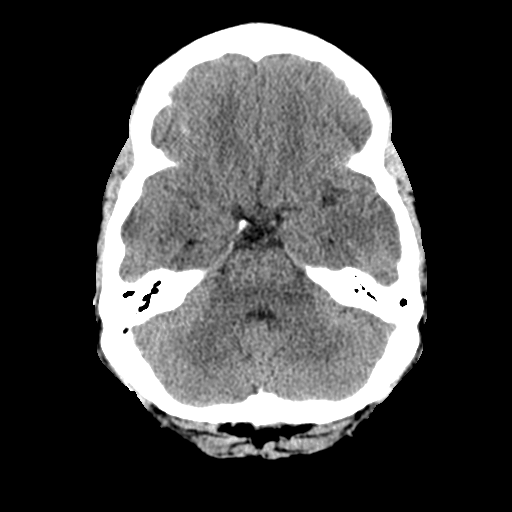
[im 9/31  brain]
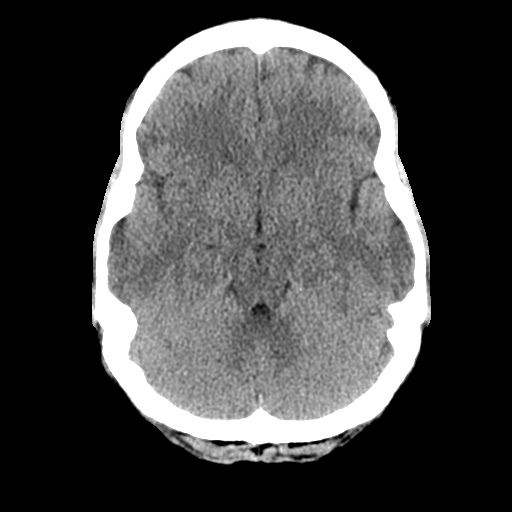
[im 13/31  brain]
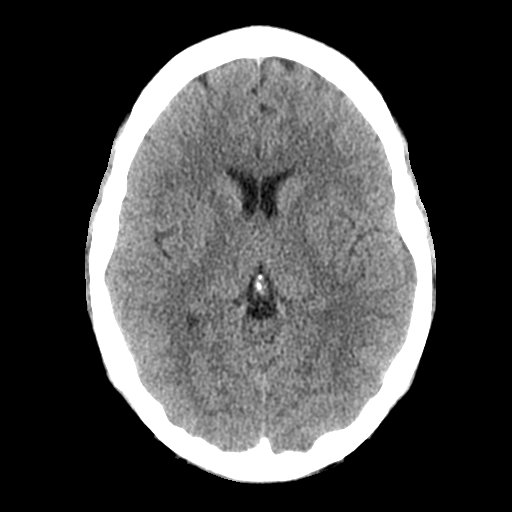
[im 16/31  brain]
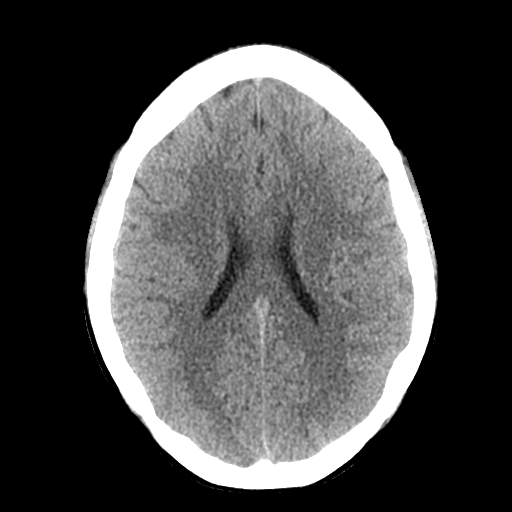
[im 16/31  bone]
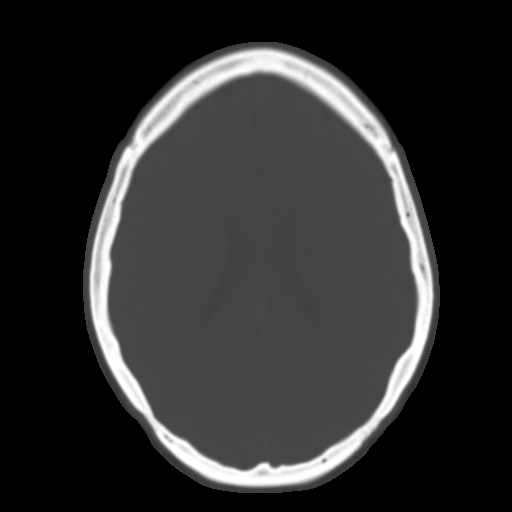
[im 18/31  brain]
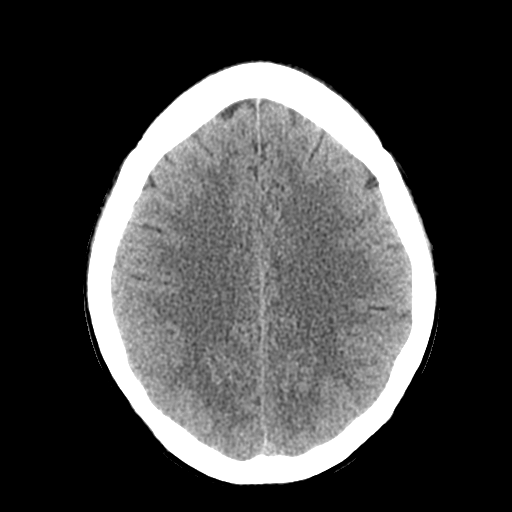
[im 22/31  brain]
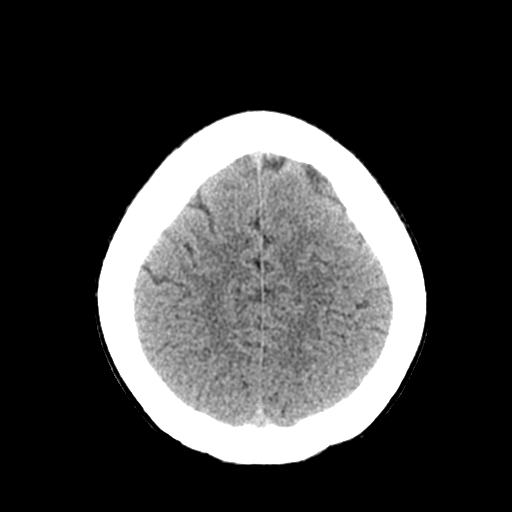
[im 24/31  brain]
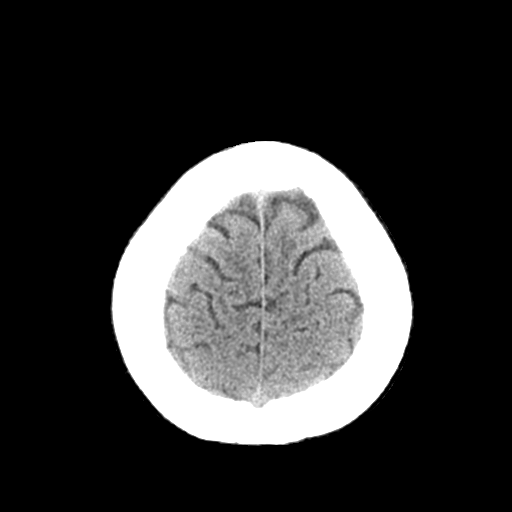
[im 28/31  brain]
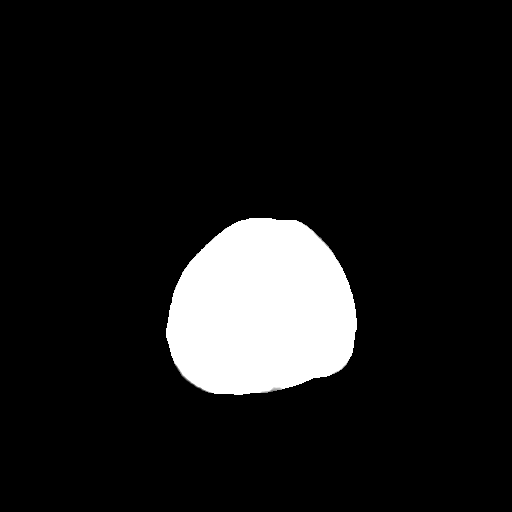
[im 28/31  bone]
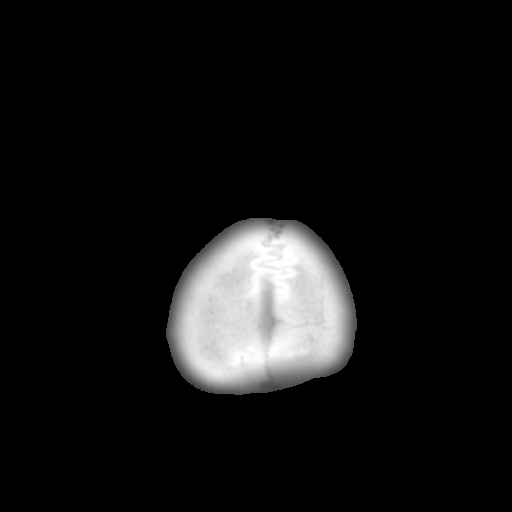

[Series 5: coronal · coronal · 0.28mm/px · 3 of 70 slices shown]
[im 24/70  brain]
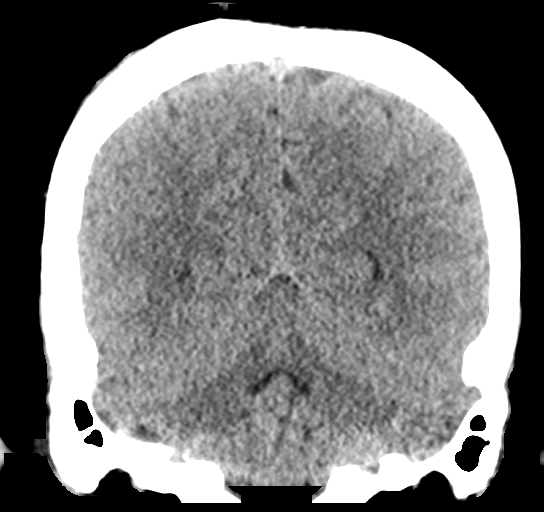
[im 31/70  brain]
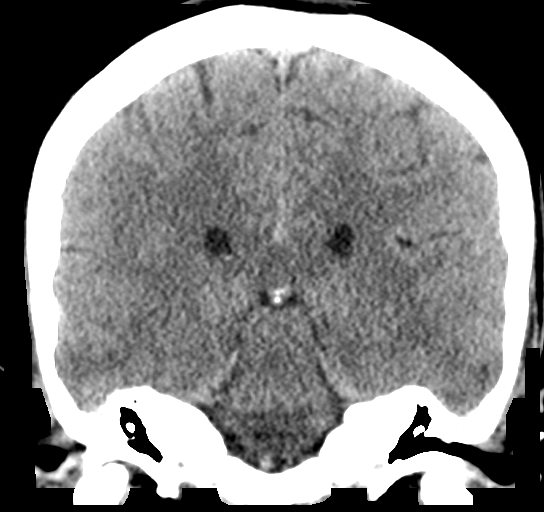
[im 39/70  brain]
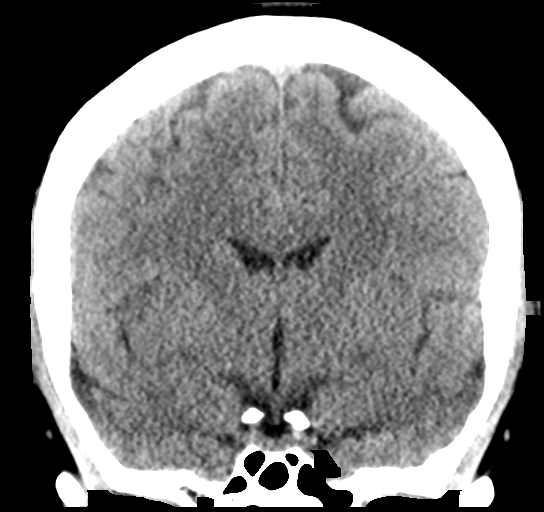

[Series 6: sagittal · sagittal · 0.29mm/px · 3 of 52 slices shown]
[im 18/52  brain]
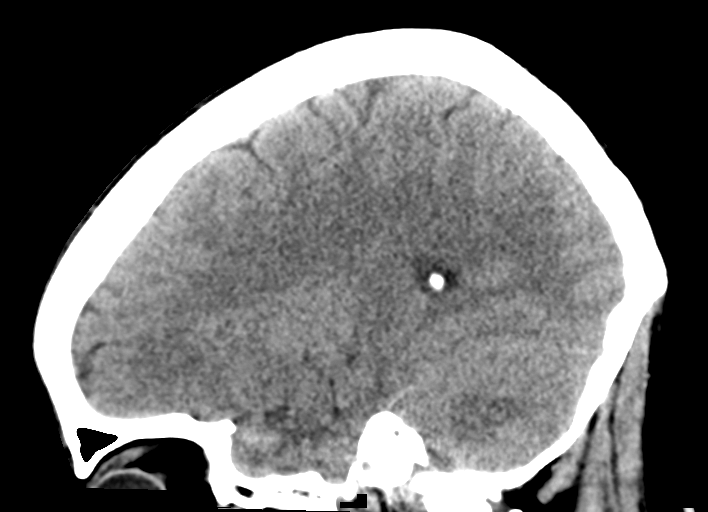
[im 26/52  brain]
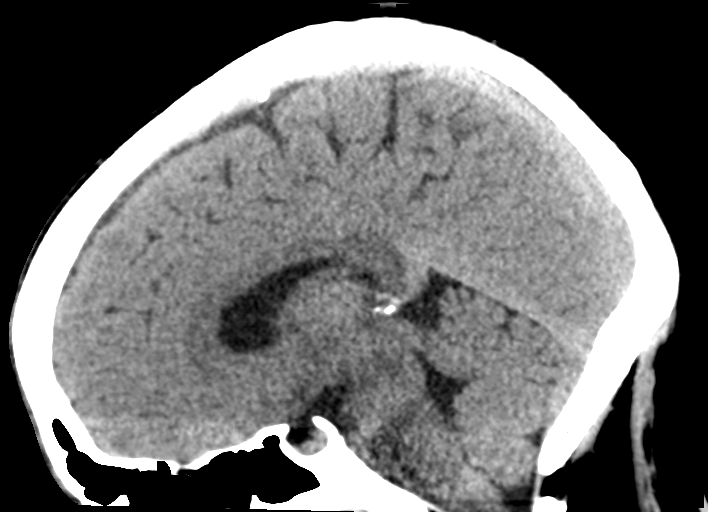
[im 35/52  brain]
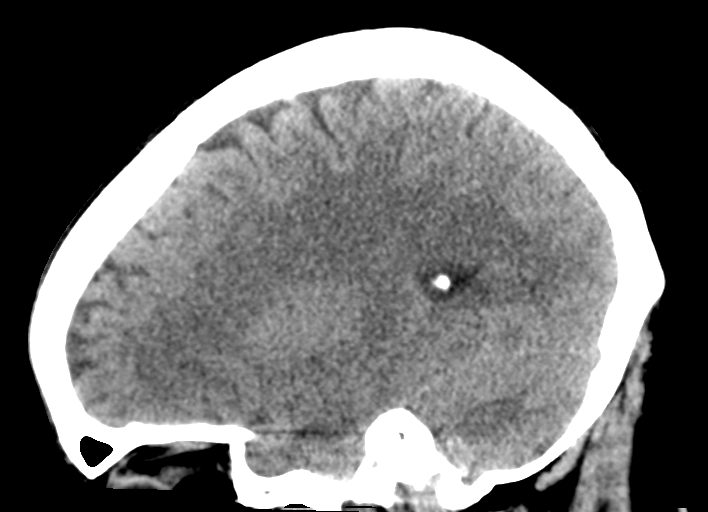

[15 of 47 positions shown; findings below may reference images not displayed]

FINDINGS: Brain: Normal. No evidence of acute infarction, hemorrhage,
hydrocephalus, extra-axial collection or mass lesion/mass effect.

Vascular: No hyperdense vessel or unexpected calcification.

Skull: Normal. Negative for fracture or focal lesion.

Sinuses/Orbits: Negative
IMPRESSION: Negative head CT.

## 2018-06-20 ENCOUNTER — Encounter: Payer: Self-pay | Admitting: Gastroenterology

## 2018-06-20 ENCOUNTER — Ambulatory Visit (INDEPENDENT_AMBULATORY_CARE_PROVIDER_SITE_OTHER): Payer: BLUE CROSS/BLUE SHIELD | Admitting: Gastroenterology

## 2018-06-20 ENCOUNTER — Other Ambulatory Visit (INDEPENDENT_AMBULATORY_CARE_PROVIDER_SITE_OTHER): Payer: BLUE CROSS/BLUE SHIELD

## 2018-06-20 VITALS — HR 79 | Ht 61.5 in | Wt 192.4 lb

## 2018-06-20 DIAGNOSIS — A045 Campylobacter enteritis: Secondary | ICD-10-CM | POA: Diagnosis not present

## 2018-06-20 DIAGNOSIS — Z8719 Personal history of other diseases of the digestive system: Secondary | ICD-10-CM | POA: Diagnosis not present

## 2018-06-20 NOTE — Patient Instructions (Signed)
If you are age 35 or older, your body mass index should be between 23-30. Your Body mass index is 35.76 kg/m. If this is out of the aforementioned range listed, please consider follow up with your Primary Care Provider.  If you are age 35 or younger, your body mass index should be between 19-25. Your Body mass index is 35.76 kg/m. If this is out of the aformentioned range listed, please consider follow up with your Primary Care Provider.   You have been scheduled for a colonoscopy. Please follow written instructions given to you at your visit today.  Please pick up your prep supplies at the pharmacy within the next 1-3 days. If you use inhalers (even only as needed), please bring them with you on the day of your procedure. Your physician has requested that you go to www.[email or web address].[email or web address] and enter the access code given to you at your visit today. This web site gives a general overview about your procedure. However, you should still follow specific instructions given to you by our office regarding your preparation for the procedure.  Please purchase the following medications over the counter and take as directed: Align 1 tablet by mouth once daily.  Please go to the lab on the 2nd floor [address] before you leave the office today.   Thank you,  Dr. Lynann Bolognaajesh Gupta

## 2018-06-20 NOTE — Progress Notes (Signed)
IMPRESSION and PLAN:    #1. Campylobacter colits (admission from 7/7 to 05/13/2018) with CT showing ascending and transverse colitis. - Took Zithromax x 7 days. - Start Align 1 tab po qd - Check CBC, CMP, lipase today. - Check stool for GI pathogen, WBC. - Recommend colonoscopy in 8-12 weeks. I have discussed risks and benefits in detail.     #2.  History of epigastric pain and  h/o Pancreatitis. Neg CT 05/2018 for pancreatitis.  Normal gallbladder. E[facility] and ? [facility] - neg. -Please obtain [facility]       HPI:    Chief Complaint:   Alexa Horn is a 37 y.o. female  FU from the hospital admission from 05/07/2018 to 05/13/2018 with Campylobacter colitis. Patient does work in a chicken farm. Has been interviewed by health department. Treated with antibiotics for 7 days Comes back to the GI clinic for follow-up visit. Doing somewhat better Still with baseline diarrhea especially after eating 4-5 times per day without any nocturnal symptoms. Has abdominal bloating and chronic abdominal epigastric pain. Mild nausea No vomiting No significant heartburn Is concerned about the pancreas Had EGD and questionable E[facility] done [facility] [place].  I could not find any records from care everywhere.    Past Medical History:  Diagnosis Date  . ADHD (attention deficit hyperactivity disorder)   . Chronic headaches   . Depression   . History of migraine headaches   . Infectious colitis 05/2018  . Pancreatitis 2004   Idiopathic? dx when she was pregnant, she gets flareups when she eats tomato and decongestant, [place]  . Urticaria     Current Outpatient Medications  Medication Sig Dispense Refill  . aspirin-acetaminophen-caffeine (EXCEDRIN MIGRAINE) 250-250-65 MG tablet Take 2 tablets by mouth every 6 (six) hours as needed for headache.    . Diclofenac Sodium 1 % CREA Place onto the skin as needed.    . diphenhydrAMINE  (BENADRYL) 25 mg capsule Take 1 capsule (25 mg total) by mouth every 6 (six) hours as needed. 30 capsule 0  . EPINEPHrine (EPIPEN 2-PAK) 0.3 mg/0.3 mL IJ SOAJ injection Inject 0.3 mLs (0.3 mg total) into the skin as needed. 2 Device 1  . hyoscyamine (ANASPAZ) 0.125 MG TBDP disintergrating tablet Place 0.125 mg under the tongue every 4 (four) hours as needed.    . loratadine (CLARITIN) 10 MG tablet Take 10 mg by mouth daily.    . phentermine (ADIPEX-P) 37.5 MG tablet Take 37.5 mg by mouth daily.   0  . promethazine (PHENERGAN) 25 MG tablet Take 25 mg by mouth every 6 (six) hours as needed for nausea or vomiting.    . sertraline (ZOLOFT) 100 MG tablet Take 100 mg by mouth daily.  3   No current facility-administered medications for this visit.     Past Surgical History:  Procedure Laterality Date  . ADENOIDECTOMY    . TONSILLECTOMY AND ADENOIDECTOMY  ~ 1996  . TUBAL LIGATION    . UMBILICAL HERNIA REPAIR  ~ 2004  . UPPER GASTROINTESTINAL ENDOSCOPY     between 2004 and 2010.  Dr Chales AbrahamsGupta.  No records found in [facility].    Family History  Problem Relation Age of Onset  . Breast cancer Maternal Aunt   . Breast cancer Maternal Grandmother   . Heart disease Maternal Grandfather   . Hypercholesterolemia Maternal Grandfather   .  Hypertension Maternal Grandfather     Social History   Tobacco Use  . Smoking status: Never Smoker  . Smokeless tobacco: Never Used  Substance Use Topics  . Alcohol use: Yes    Comment: couple of glasses of wine or beer less than every week.    . Drug use: No    Allergies  Allergen Reactions  . Ceftin [Cefuroxime Axetil] Hives  . Hydrocodone-Acetaminophen Itching  . Percocet [Oxycodone-Acetaminophen] Itching  . Tape Hives    Clear tape causes reaction but paper tape is fine  . Bioflavonoids Other (See Comments)    Advised caused yeast infections when she was younger  . Lactose Intolerance (Gi)   . Orange Fruit [Citrus]     Advised  caused yeast infections when she was younger     Review of Systems: All systems reviewed and negative except where noted in HPI.    Physical Exam:     Pulse 79   Ht 5' 1.5" (1.562 m)   Wt 192 lb 6 oz (87.3 kg)   BMI 35.76 kg/m  @WEIGHTLAST3 @ GENERAL:  Alert, oriented, cooperative, not in acute distress. PSYCH: :Pleasant, normal mood and affect. HEENT:  conjunctiva pink, mucous membranes moist, neck supple without masses. No jaundice. CARDIAC:  S1 S2 normal. No murmers. PULM: Normal respiratory effort, lungs CTA bilaterally, no wheezing. ABDOMEN: Inspection: No visible peristalsis, no abnormal pulsations, skin normal.  Palpation/percussion: Soft, nontender, nondistended, no rigidity, no abnormal dullness to percussion, no hepatosplenomegaly and no palpable abdominal masses.  Auscultation: Normal bowel sounds, no abdominal bruits. Rectal exam: Deferred SKIN:  turgor, no lesions seen. Musculoskeletal:  Normal muscle tone, normal strength. NEURO: Alert and oriented x 3, no focal neurologic deficits. Seen in presence of Oliver BarreHeather Cole CMA.    Adrik Khim,MD 06/20/2018, 3:43 PM   CC O'Buch, Greta, PA-C

## 2018-06-21 ENCOUNTER — Encounter: Payer: Self-pay | Admitting: Neurology

## 2018-06-21 LAB — CBC WITH DIFFERENTIAL/PLATELET
Basophils Absolute: 0.1 10*3/uL (ref 0.0–0.1)
Basophils Relative: 1.3 % (ref 0.0–3.0)
Eosinophils Absolute: 0.2 10*3/uL (ref 0.0–0.7)
Eosinophils Relative: 2.6 % (ref 0.0–5.0)
HCT: 38.3 % (ref 36.0–46.0)
Hemoglobin: 12.7 g/dL (ref 12.0–15.0)
Lymphocytes Relative: 29.1 % (ref 12.0–46.0)
Lymphs Abs: 2.8 10*3/uL (ref 0.7–4.0)
MCHC: 33.1 g/dL (ref 30.0–36.0)
MCV: 88.6 fl (ref 78.0–100.0)
Monocytes Absolute: 0.6 10*3/uL (ref 0.1–1.0)
Monocytes Relative: 6.1 % (ref 3.0–12.0)
Neutro Abs: 5.9 10*3/uL (ref 1.4–7.7)
Neutrophils Relative %: 60.9 % (ref 43.0–77.0)
Platelets: 316 10*3/uL (ref 150.0–400.0)
RBC: 4.32 Mil/uL (ref 3.87–5.11)
RDW: 13.4 % (ref 11.5–15.5)
WBC: 9.7 10*3/uL (ref 4.0–10.5)

## 2018-06-21 LAB — COMPREHENSIVE METABOLIC PANEL
ALT: 21 U/L (ref 0–35)
AST: 20 U/L (ref 0–37)
Albumin: 4.2 g/dL (ref 3.5–5.2)
Alkaline Phosphatase: 56 U/L (ref 39–117)
BUN: 10 mg/dL (ref 6–23)
CO2: 27 mEq/L (ref 19–32)
Calcium: 9.3 mg/dL (ref 8.4–10.5)
Chloride: 103 mEq/L (ref 96–112)
Creatinine, Ser: 0.82 mg/dL (ref 0.40–1.20)
GFR: 84.02 mL/min (ref >60.00)
Glucose, Bld: 84 mg/dL (ref 70–99)
Potassium: 4.5 mEq/L (ref 3.5–5.1)
Sodium: 138 mEq/L (ref 135–145)
Total Bilirubin: 0.5 mg/dL (ref 0.2–1.2)
Total Protein: 7 g/dL (ref 6.0–8.3)

## 2018-06-21 LAB — LIPASE: Lipase: 14 U/L (ref 11.0–59.0)

## 2018-06-23 ENCOUNTER — Other Ambulatory Visit: Payer: BLUE CROSS/BLUE SHIELD

## 2018-06-23 DIAGNOSIS — A045 Campylobacter enteritis: Secondary | ICD-10-CM

## 2018-06-23 DIAGNOSIS — Z8719 Personal history of other diseases of the digestive system: Secondary | ICD-10-CM

## 2018-06-26 LAB — FECAL LACTOFERRIN, QUANT
Fecal Lactoferrin: NEGATIVE
MICRO NUMBER:: 91009627
SPECIMEN QUALITY:: ADEQUATE

## 2018-06-26 LAB — GASTROINTESTINAL PATHOGEN PANEL PCR
C. difficile Tox A/B, PCR: NOT DETECTED
Campylobacter, PCR: NOT DETECTED
Cryptosporidium, PCR: NOT DETECTED
E coli (ETEC) LT/ST PCR: NOT DETECTED
E coli (STEC) stx1/stx2, PCR: NOT DETECTED
E coli 0157, PCR: NOT DETECTED
Giardia lamblia, PCR: NOT DETECTED
Norovirus, PCR: NOT DETECTED
Rotavirus A, PCR: NOT DETECTED
Salmonella, PCR: NOT DETECTED
Shigella, PCR: NOT DETECTED

## 2018-07-17 ENCOUNTER — Emergency Department (HOSPITAL_COMMUNITY)
Admission: EM | Admit: 2018-07-17 | Discharge: 2018-07-17 | Disposition: A | Discharge status: Home / Self Care | Payer: BLUE CROSS/BLUE SHIELD | Attending: Emergency Medicine | Admitting: Emergency Medicine

## 2018-07-17 ENCOUNTER — Other Ambulatory Visit: Payer: Self-pay

## 2018-07-17 DIAGNOSIS — M26601 Right temporomandibular joint disorder, unspecified: Secondary | ICD-10-CM | POA: Diagnosis not present

## 2018-07-17 DIAGNOSIS — Z7982 Long term (current) use of aspirin: Secondary | ICD-10-CM | POA: Insufficient documentation

## 2018-07-17 DIAGNOSIS — Z79899 Other long term (current) drug therapy: Secondary | ICD-10-CM | POA: Diagnosis not present

## 2018-07-17 DIAGNOSIS — R6884 Jaw pain: Secondary | ICD-10-CM | POA: Diagnosis present

## 2018-07-17 DIAGNOSIS — M26629 Arthralgia of temporomandibular joint, unspecified side: Secondary | ICD-10-CM

## 2018-07-17 MED ORDER — MELOXICAM 7.5 MG PO TABS: 7.5000 mg | ORAL_TABLET | Freq: Every day | ORAL | 0 refills | Status: AC

## 2018-07-17 MED ORDER — KETOROLAC TROMETHAMINE 15 MG/ML IJ SOLN
15.0000 mg | Freq: Once | INTRAMUSCULAR | Status: AC
  Administered 2018-07-17: 15 mg via INTRAVENOUS
  Filled 2018-07-17: qty 1

## 2018-07-17 MED ORDER — CYCLOBENZAPRINE HCL 10 MG PO TABS: 10.0000 mg | ORAL_TABLET | Freq: Two times a day (BID) | ORAL | 0 refills | Status: DC | PRN

## 2018-07-17 MED ORDER — CYCLOBENZAPRINE HCL 10 MG PO TABS
5.0000 mg | ORAL_TABLET | Freq: Once | ORAL | Status: AC
  Administered 2018-07-17: 5 mg via ORAL
  Filled 2018-07-17: qty 1

## 2018-07-17 NOTE — ED Provider Notes (Signed)
[facility] EMERGENCY DEPARTMENT Provider Note   CSN: [id number] Arrival date & time: 07/17/18  2145     History   Chief Complaint Chief Complaint  Patient presents with  . Jaw Pain    HPI Alexa Horn is a 37 y.o. female.  36 year old female presents with complaint of right side jaw pain.  Patient states that her jaw has bothered her off and on for the past 6 months since her last dental cleaning discussed with her dentist who thought she may have TMJ but was unable to evaluate her at that time.  Patient denies injury to her jaw or teeth, dental problems affecting the side, fevers, drainage, swelling.  Patient is taking Motrin with limited relief, applying ice.  No other complaints or concerns.     Past Medical History:  Diagnosis Date  . ADHD (attention deficit hyperactivity disorder)   . Chronic headaches   . Depression   . History of migraine headaches   . Infectious colitis 05/2018  . Pancreatitis 2004   Idiopathic? dx when she was pregnant, she gets flareups when she eats tomato and decongestant, was seen by Dr Chales Abrahams in [place]  . Urticaria     Patient Active Problem List   Diagnosis Date Noted  . Colitis 05/07/2018    Past Surgical History:  Procedure Laterality Date  . ADENOIDECTOMY    . TONSILLECTOMY AND ADENOIDECTOMY  ~ 1996  . TUBAL LIGATION    . UMBILICAL HERNIA REPAIR  ~ 2004  . UPPER GASTROINTESTINAL ENDOSCOPY     between 2004 and 2010.  Dr Chales Abrahams.  No records found in [facility] Archive.     OB History   None      Home Medications    Prior to Admission medications   Medication Sig Start Date End Date Taking? Authorizing Provider  aspirin-acetaminophen-caffeine (EXCEDRIN MIGRAINE) [phone] MG tablet Take 2 tablets by mouth every 6 (six) hours as needed for headache.    [provider]  cyclobenzaprine (FLEXERIL) 10 MG tablet Take 1 tablet (10 mg total) by mouth 2 (two) times daily as needed for muscle  spasms. 07/17/18   Jeannie Fend, PA-C  Diclofenac Sodium 1 % CREA Place onto the skin as needed.    [provider]  diphenhydrAMINE (BENADRYL) 25 mg capsule Take 1 capsule (25 mg total) by mouth every 6 (six) hours as needed. 03/09/17   Audry Pili, PA-C  EPINEPHrine (EPIPEN 2-PAK) 0.3 mg/0.3 mL IJ SOAJ injection Inject 0.3 mLs (0.3 mg total) into the skin as needed. 05/13/18   Albertine Grates, MD  hyoscyamine (ANASPAZ) 0.125 MG TBDP disintergrating tablet Place 0.125 mg under the tongue every 4 (four) hours as needed.    [provider]  loratadine (CLARITIN) 10 MG tablet Take 10 mg by mouth daily.    [provider]  meloxicam (MOBIC) 7.5 MG tablet Take 1 tablet (7.5 mg total) by mouth daily for 10 days. 07/17/18 07/27/18  Jeannie Fend, PA-C  phentermine (ADIPEX-P) 37.5 MG tablet Take 37.5 mg by mouth daily.  09/26/17   [provider]  promethazine (PHENERGAN) 25 MG tablet Take 25 mg by mouth every 6 (six) hours as needed for nausea or vomiting.    [provider]  sertraline (ZOLOFT) 100 MG tablet Take 100 mg by mouth daily. 04/26/18   [provider]    Family History Family History  Problem Relation Age of Onset  . Breast cancer Maternal Aunt   .  Breast cancer Maternal Grandmother   . Heart disease Maternal Grandfather   . Hypercholesterolemia Maternal Grandfather   . Hypertension Maternal Grandfather     Social History Social History   Tobacco Use  . Smoking status: Never Smoker  . Smokeless tobacco: Never Used  Substance Use Topics  . Alcohol use: Yes    Comment: couple of glasses of wine or beer less than every week.    . Drug use: No     Allergies   Ceftin [cefuroxime axetil]; Hydrocodone-acetaminophen; Percocet [oxycodone-acetaminophen]; Tape; Bioflavonoids; Lactose intolerance (gi); and Orange fruit [citrus]   Review of Systems Review of Systems  Constitutional: Negative for chills and fever.  HENT: Negative for  congestion, dental problem, facial swelling, trouble swallowing and voice change.   Gastrointestinal: Negative for vomiting.  Musculoskeletal: Negative for neck pain and neck stiffness.  Skin: Negative for rash and wound.  Allergic/Immunologic: Negative for immunocompromised state.  Neurological: Negative for headaches.  Hematological: Negative for adenopathy.  Psychiatric/Behavioral: Negative for confusion.  All other systems reviewed and are negative.    Physical Exam Updated Vital Signs BP (!) 150/99 (BP Location: Right Arm)   Pulse 100   Temp 98.3 F (36.8 C)   Resp 16   Ht 5\' 1"  (1.549 m)   Wt 87.5 kg   LMP 07/10/2018 (Approximate)   SpO2 98%   BMI 36.47 kg/m   Physical Exam  Constitutional: She is oriented to person, place, and time. She appears well-developed and well-nourished. No distress.  HENT:  Head: Normocephalic and atraumatic.    Right Ear: Tympanic membrane, external ear and ear canal normal.  Left Ear: Tympanic membrane, external ear and ear canal normal.  Mouth/Throat: Uvula is midline and mucous membranes are normal. Normal dentition. No dental abscesses, uvula swelling or dental caries.  Very slight trismus   Neck: Neck supple.  Pulmonary/Chest: Effort normal.  Musculoskeletal: She exhibits no deformity.  Lymphadenopathy:    She has no cervical adenopathy.  Neurological: She is alert and oriented to person, place, and time.  Skin: Skin is warm and dry. No rash noted. She is not diaphoretic.  Psychiatric: She has a normal mood and affect. Her behavior is normal.  Nursing note and vitals reviewed.    ED Treatments / Results  Labs (all labs ordered are listed, but only abnormal results are displayed) Labs Reviewed - No data to display  EKG None  Radiology No results found.  Procedures Procedures (including critical care time)  Medications Ordered in ED Medications  ketorolac (TORADOL) 15 MG/ML injection 15 mg (has no administration in  time range)  cyclobenzaprine (FLEXERIL) tablet 5 mg (has no administration in time range)     Initial Impression / Assessment and Plan / ED Course  I have reviewed the triage vital signs and the nursing notes.  Pertinent labs & imaging results that were available during my care of the patient were reviewed by me and considered in my medical decision making (see chart for details).  Clinical Course as of Jul 17 2320  Mon Jul 17, 2018  [id number] 37 year old female with complaint of pain over right TMJ.  Denies injury or dental problem affecting the side.  Patient was given injection of Toradol and a Flexeril on the ER, discharged home with Flexeril and meloxicam and dental referral follow-up.   [LM]    Clinical Course User Index [LM] Jeannie FendMurphy, Florina Glas A, PA-C    Final Clinical Impressions(s) / ED Diagnoses   Final diagnoses:  TMJ syndrome  ED Discharge Orders         Ordered    cyclobenzaprine (FLEXERIL) 10 MG tablet  2 times daily PRN     07/17/18 2312    meloxicam (MOBIC) 7.5 MG tablet  Daily     07/17/18 2312           Jeannie Fend, PA-C 07/17/18 2321    Margarita Grizzle, MD 07/18/18 1810

## 2018-07-17 NOTE — Discharge Instructions (Addendum)
Follow up with dentist for additional evaluation and treatment. Take Meloxicam and Flexeril as prescribed as needed. Apply warm compresses to face for 20 minutes at a time.

## 2018-07-17 NOTE — ED Triage Notes (Signed)
Patient c/o right sided jaw pain. States that it is painful to open jaw and when she does it makes a "popping" feeling.

## 2018-08-03 ENCOUNTER — Encounter: Payer: Self-pay | Admitting: Gastroenterology

## 2018-08-14 NOTE — Progress Notes (Signed)
NEUROLOGY CONSULTATION NOTE  CJ BEECHER MRN: [id number] DOB: 11/18/1980  Referring provider: Eunice Blase, PA-C Primary care provider: Eunice Blase, PA-C  Reason for consult:  headache  HISTORY OF PRESENT ILLNESS: Alexa Horn is a 37 year old right-handed female with ADHD, chronic pancreatitis and depression who presents for headache.  History supplemented by ED and referring providers notes.  Onset:  teenager Location:  Right frontal/retro-orbital, right occipital, left occipital to frontal Quality:  pounding Intensity:  10/10.  She denies new headache, thunderclap headache or severe headache that wakes her from sleep. Aura:  Sees "bubbles" and squiggly lines and sparkles Prodrome:  none Postdrome:  none Associated symptoms:  Photophobia, phonophobia, nausea, vomiting.  She denies associated autonomic symptoms or unilateral numbness or weakness. Duration:  Ongoing unless treated Frequency:  20 days of headache a month Frequency of abortive medication: almost Excedrin daily Triggers:  Night time driving/glare, flashing lights, staring at computer or TV screen, prolonged driving, loud music Exacerbating factors:  Light, noise, movement Relieving factors:  Sometimes caffeine Activity:  aggravates  If she needs to go to the ED, she gets a headache cocktail is Phenergan, Ativan and Dilaudid.  Most recently, she was seen in the ED on 07/17/18 for right jaw pain.  It started after a dental cleaning 7 months ago.  Her dentist at the time suggested it may be TMJ dysfunction but did not evaluate further.  Current NSAIDS:  none Current analgesics:  Excedrin Migraine Current triptans:  none Current ergotamine:  none Current anti-emetic:  Promethazine 25mg  Current muscle relaxants:  Flexeril Current anti-anxiolytic:  none Current sleep aide:  none Current Antihypertensive medications:  none Current Antidepressant medications:  Sertraline 100mg  daily Current Anticonvulsant  medications:  None Current anti-CGRP:  none Current Vitamins/Herbal/Supplements:  none Current Antihistamines/Decongestants:  Benadryl Other therapy:  daith piercings Other medication:  none  Past NSAIDS:  Ibuprofen, naproxen Past analgesics:  Tylenol, tramadol (does not remember if effective) Past abortive triptans:  Relpax 20mg , sumatriptan 50mg  Past abortive ergotamine:  none Past muscle relaxants:  none Past anti-emetic:  Zofran ODT 4mg  (ineffective) Past antihypertensive medications:  none Past antidepressant medications:  none Past anticonvulsant medications:  topiramate 50mg  twice daily Past anti-CGRP:  none Past vitamins/Herbal/Supplements:  none Past antihistamines/decongestants:  none Other past therapies:  none  Caffeine:  1 cup of coffee 3 times a week  Diet:  1 caffeine drink a day, 1-3 bottles of water minimum.  Limited soda Exercise:  Limited due to ankle surgery Depression: yes ; Anxiety:  sometimes Other pain:  ankle Sleep hygiene:  poor Family history of headache:  Mom, grandmother, aunts  MRI of brain without contrast from 03/11/17 personally reviewed and was normal. CT Head without contrast from 10/11/17 personally reviewed was normal.  PAST MEDICAL HISTORY: Past Medical History:  Diagnosis Date  . ADHD (attention deficit hyperactivity disorder)   . Chronic headaches   . Depression   . History of migraine headaches   . Infectious colitis 05/2018  . Pancreatitis 2004   Idiopathic? dx when she was pregnant, she gets flareups when she eats tomato and decongestant, was seen by Dr Chales Abrahams in [place]  . Urticaria     PAST SURGICAL HISTORY: Past Surgical History:  Procedure Laterality Date  . ADENOIDECTOMY    . TONSILLECTOMY AND ADENOIDECTOMY  ~ 1996  . TUBAL LIGATION    . UMBILICAL HERNIA REPAIR  ~ 2004  . UPPER GASTROINTESTINAL ENDOSCOPY     between 2004 and 2010.  Dr Chales Abrahams.  No records found in [facility].    MEDICATIONS: Current  Outpatient Medications on File Prior to Visit  Medication Sig Dispense Refill  . aspirin-acetaminophen-caffeine (EXCEDRIN MIGRAINE) 250-250-65 MG tablet Take 2 tablets by mouth every 6 (six) hours as needed for headache.    . cyclobenzaprine (FLEXERIL) 10 MG tablet Take 1 tablet (10 mg total) by mouth 2 (two) times daily as needed for muscle spasms. 20 tablet 0  . Diclofenac Sodium 1 % CREA Place onto the skin as needed.    . diphenhydrAMINE (BENADRYL) 25 mg capsule Take 1 capsule (25 mg total) by mouth every 6 (six) hours as needed. 30 capsule 0  . EPINEPHrine (EPIPEN 2-PAK) 0.3 mg/0.3 mL IJ SOAJ injection Inject 0.3 mLs (0.3 mg total) into the skin as needed. 2 Device 1  . hyoscyamine (ANASPAZ) 0.125 MG TBDP disintergrating tablet Place 0.125 mg under the tongue every 4 (four) hours as needed.    . loratadine (CLARITIN) 10 MG tablet Take 10 mg by mouth daily.    . phentermine (ADIPEX-P) 37.5 MG tablet Take 37.5 mg by mouth daily.   0  . promethazine (PHENERGAN) 25 MG tablet Take 25 mg by mouth every 6 (six) hours as needed for nausea or vomiting.    . sertraline (ZOLOFT) 100 MG tablet Take 100 mg by mouth daily.  3   No current facility-administered medications on file prior to visit.     ALLERGIES: Allergies  Allergen Reactions  . Ceftin [Cefuroxime Axetil] Hives  . Hydrocodone-Acetaminophen Itching  . Percocet [Oxycodone-Acetaminophen] Itching  . Tape Hives    Clear tape causes reaction but paper tape is fine  . Bioflavonoids Other (See Comments)    Advised caused yeast infections when she was younger  . Lactose Intolerance (Gi)   . Orange Fruit [Citrus]     Advised caused yeast infections when she was younger    FAMILY HISTORY: Family History  Problem Relation Age of Onset  . Breast cancer Maternal Aunt   . Breast cancer Maternal Grandmother   . Heart disease Maternal Grandfather   . Hypercholesterolemia Maternal Grandfather   . Hypertension Maternal Grandfather    SOCIAL  HISTORY: Social History   Socioeconomic History  . Marital status: Married    Spouse name: Not on file  . Number of children: 6  . Years of education: Not on file  . Highest education level: Not on file  Occupational History  . 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    Social Needs  . Financial resource strain: Not on file  . Food insecurity:    Worry: Not on file    Inability: Not on file  . Transportation needs:    Medical: Not on file    Non-medical: Not on file  Tobacco Use  . Smoking status: Never Smoker  . Smokeless tobacco: Never Used  Substance and Sexual Activity  . Alcohol use: Yes    Comment: couple of glasses of wine or beer less than every week.    . Drug use: No  . Sexual activity: Yes    Birth control/protection: Surgical    Comment: s/p tubal ligation  Lifestyle  . Physical activity:    Days per week: Not on file    Minutes per session: Not on file  . Stress: Not on file  Relationships  . Social connections:    Talks on phone: Not on file    Gets together:  Not on file    Attends religious service: Not on file    Active member of club or organization: Not on file    Attends meetings of clubs or organizations: Not on file    Relationship status: Not on file  . Intimate partner violence:    Fear of current or ex partner: Not on file    Emotionally abused: Not on file    Physically abused: Not on file    Forced sexual activity: Not on file  Other Topics Concern  . Not on file  Social History Narrative  . Not on file    REVIEW OF SYSTEMS: Constitutional: No fevers, chills, or sweats, no generalized fatigue, change in appetite Eyes: No visual changes, double vision, eye pain Ear, nose and throat: No hearing loss, ear pain, nasal congestion, sore throat Cardiovascular: No chest pain, palpitations Respiratory:  No shortness of breath at rest or with exertion, wheezes GastrointestinaI: No nausea, vomiting, diarrhea,  abdominal pain, fecal incontinence Genitourinary:  No dysuria, urinary retention or frequency Musculoskeletal: right ankle pain Integumentary: No rash, pruritus, skin lesions Neurological: as above Psychiatric: No depression, insomnia, anxiety Endocrine: No palpitations, fatigue, diaphoresis, mood swings, change in appetite, change in weight, increased thirst Hematologic/Lymphatic:  No purpura, petechiae. Allergic/Immunologic: no itchy/runny eyes, nasal congestion, recent allergic reactions, rashes  PHYSICAL EXAM: Blood pressure 118/78, pulse 86, height 5' 1.5" (1.562 m), weight 203 lb (92.1 kg), SpO2 99 %. General: No acute distress.  Patient appears well-groomed.   Head:  Normocephalic/atraumatic Eyes:  fundi examined but not visualized Neck: supple, no paraspinal tenderness, full range of motion Back: No paraspinal tenderness Heart: regular rate and rhythm Lungs: Clear to auscultation bilaterally. Vascular: No carotid bruits. Neurological Exam: Mental status: alert and oriented to person, place, and time, recent and remote memory intact, fund of knowledge intact, attention and concentration intact, speech fluent and not dysarthric, language intact. Cranial nerves: CN I: not tested CN II: pupils equal, round and reactive to light, visual fields intact CN III, IV, VI:  full range of motion, no nystagmus, no ptosis CN V: facial sensation intact CN VII: upper and lower face symmetric CN VIII: hearing intact CN IX, X: gag intact, uvula midline CN XI: sternocleidomastoid and trapezius muscles intact CN XII: tongue midline Bulk & Tone: normal, no fasciculations. Motor:  5/5 throughout  Sensation: temperature and vibration sensation intact. Deep Tendon Reflexes:  2+ throughout, toes downgoing.  Finger to nose testing:  Without dysmetria.  Heel to shin:  Without dysmetria.  Gait:  Antalgic gait.  Romberg negative.  IMPRESSION: Chronic migraine with and without aura, without status  migrainosus, not intractable  PLAN: 1.  For preventative management, start propranolol ER 60 mg daily.  If headaches not improved in 4 weeks, she is to contact us and we can increase dose to 80 mg daily.  If she starts experiencing dizziness, she was instructed to contact us. 2.  For abortive therapy, we will try Maxalt 10 mg 3. She will stop Excedrin.  Limit use of pain relievers to no more than 2 days out of week to prevent risk of rebound or medication-overuse headache. 4.  Keep headache diary 5.  Exercise, hydration, caffeine cessation, sleep hygiene, monitor for and avoid triggers 6.  Consider:  magnesium citrate 400mg  daily, riboflavin 400mg  daily, and coenzyme Q10 100mg  three times daily 7.  Follow up in 3 to 4 months   Thank you for allowing me to take part in the care of this patient.  Shon Millet, DO  CC:  Edgardo Roys O'Buch

## 2018-08-15 ENCOUNTER — Ambulatory Visit (INDEPENDENT_AMBULATORY_CARE_PROVIDER_SITE_OTHER): Payer: BLUE CROSS/BLUE SHIELD | Admitting: Neurology

## 2018-08-15 ENCOUNTER — Encounter: Payer: Self-pay | Admitting: Neurology

## 2018-08-15 VITALS — BP 118/78 | HR 86 | Ht 61.5 in | Wt 203.0 lb

## 2018-08-15 DIAGNOSIS — G43109 Migraine with aura, not intractable, without status migrainosus: Secondary | ICD-10-CM

## 2018-08-15 DIAGNOSIS — G43709 Chronic migraine without aura, not intractable, without status migrainosus: Secondary | ICD-10-CM | POA: Diagnosis not present

## 2018-08-15 MED ORDER — RIZATRIPTAN BENZOATE 10 MG PO TBDP: ORAL_TABLET | ORAL | 3 refills | Status: DC

## 2018-08-15 MED ORDER — PROPRANOLOL HCL ER 60 MG PO CP24: 60.0000 mg | ORAL_CAPSULE | Freq: Every day | ORAL | 3 refills | Status: DC

## 2018-08-15 NOTE — Patient Instructions (Signed)
Migraine Recommendations: 1.  Start propranolol ER 60mg  daily.  Contact me in 4 weeks with update and we can adjust dose if needed. 2.  Take rizatriptan 10mg  at earliest onset of headache.  May repeat dose once in 2 hours if needed.  Do not exceed two tablets in 24 hours. 3.  STOP EXCEDRIN.  Limit use of pain relievers to no more than 2 days out of the week.  These medications include acetaminophen, ibuprofen, triptans and narcotics.  This will help reduce risk of rebound headaches. 4.  Be aware of common food triggers such as processed sweets, processed foods with nitrites (such as deli meat, hot dogs, sausages), foods with MSG, alcohol (such as wine), chocolate, certain cheeses, certain fruits (dried fruits, bananas, some citrus fruit), vinegar, diet soda. 4.  Avoid caffeine 5.  Routine exercise 6.  Proper sleep hygiene 7.  Stay adequately hydrated with water 8.  Keep a headache diary. 9.  Maintain proper stress management. 10.  Do not skip meals. 11.  Consider supplements:  Magnesium citrate 400mg  to 600mg  daily, riboflavin 400mg , Coenzyme Q 10 100mg  three times daily 12.  Follow up in 3 to 4 months

## 2018-08-17 ENCOUNTER — Ambulatory Visit (AMBULATORY_SURGERY_CENTER): Payer: BLUE CROSS/BLUE SHIELD | Admitting: Gastroenterology

## 2018-08-17 ENCOUNTER — Encounter: Payer: Self-pay | Admitting: Gastroenterology

## 2018-08-17 VITALS — BP 117/66 | HR 75 | Temp 99.8°F | Resp 14 | Ht 61.5 in | Wt 203.0 lb

## 2018-08-17 DIAGNOSIS — R197 Diarrhea, unspecified: Secondary | ICD-10-CM

## 2018-08-17 DIAGNOSIS — A045 Campylobacter enteritis: Secondary | ICD-10-CM

## 2018-08-17 DIAGNOSIS — K648 Other hemorrhoids: Secondary | ICD-10-CM

## 2018-08-17 MED ORDER — SODIUM CHLORIDE 0.9 % IV SOLN: 500.0000 mL | Freq: Once | INTRAVENOUS | Status: DC

## 2018-08-17 NOTE — Op Note (Addendum)
[facility] Patient Name: Alexa Horn Procedure Date: 08/17/2018 1:23 PM MRN: [id number] Endoscopist: Lynann Bologna , MD Age: 37 Referring MD:  Date of Birth: [birth year] Gender: Female Account #: [id number] Procedure:                Colonoscopy Indications:              Clinically significant diarrhea of unexplained                            origin. H/O Campylobacter colitis in the past. Medicines:                Monitored Anesthesia Care Procedure:                Pre-Anesthesia Assessment:                           - Prior to the procedure, a History and Physical                            was performed, and patient medications and                            allergies were reviewed. The patient's tolerance of                            previous anesthesia was also reviewed. The risks                            and benefits of the procedure and the sedation                            options and risks were discussed with the patient.                            All questions were answered, and informed consent                            was obtained. Prior Anticoagulants: The patient has                            taken no previous anticoagulant or antiplatelet                            agents. ASA Grade Assessment: I - A normal, healthy                            patient. After reviewing the risks and benefits,                            the patient was deemed in satisfactory condition to                            undergo the procedure.  After obtaining informed consent, the colonoscope                            was passed under direct vision. Throughout the                            procedure, the patient's blood pressure, pulse, and                            oxygen saturations were monitored continuously. The                            Model PCF-[id number]) scope was introduced                            through the anus and advanced to  the 4 cm into the                            ileum. The colonoscopy was performed without                            difficulty. The patient tolerated the procedure                            well. The quality of the bowel preparation was good                            except in the right colon some stool. Aggressive                            suctioning and aspiration was performed. Overall                            examination was adequate. Scope In: 1:27:41 PM Scope Out: 1:39:13 PM Scope Withdrawal Time: 0 hours 9 minutes 4 seconds  Total Procedure Duration: 0 hours 11 minutes 32 seconds  Findings:                 The colonic mucosa was normal throughout. Multiple                            random colonic biopsies were obtained and sent for                            histology to rule out microscopic colitis. The                            terminal ileal mucosa was normal. Multiple terminal                            ileal biopsies were also obtained.                           Non-bleeding internal hemorrhoids were found during  retroflexion. The hemorrhoids were small.                           The exam was otherwise without abnormality on                            direct and retroflexion views. Complications:            No immediate complications. Estimated Blood Loss:     Estimated blood loss: none. Impression:               - Minimal internal hemorrhoids.                           - Otherwise normal colonoscopy to TI. Recommendation:           - Patient has a contact number available for                            emergencies. The signs and symptoms of potential                            delayed complications were discussed with the                            patient. Return to normal activities tomorrow.                            Written discharge instructions were provided to the                            patient.                           -  Resume previous diet.                           - Continue Bentyl 10 mg p.o. twice daily prn.                           - Await pathology results.                           - Return to GI clinic PRN. Lynann Bologna, MD 08/17/2018 1:44:57 PM This report has been signed electronically.

## 2018-08-17 NOTE — Progress Notes (Signed)
Called to room to assist during endoscopic procedure.  Patient ID and intended procedure confirmed with present staff. Received instructions for my participation in the procedure from the performing physician.  

## 2018-08-17 NOTE — Patient Instructions (Signed)
**   Continue taking Bentyl **  YOU HAD AN ENDOSCOPIC PROCEDURE TODAY AT THE [facility]:   Refer to the procedure report that was given to you for any specific questions about what was found during the examination.  If the procedure report does not answer your questions, please call your gastroenterologist to clarify.  If you requested that your care partner not be given the details of your procedure findings, then the procedure report has been included in a sealed envelope for you to review at your convenience later.  YOU SHOULD EXPECT: Some feelings of bloating in the abdomen. Passage of more gas than usual.  Walking can help get rid of the air that was put into your GI tract during the procedure and reduce the bloating. If you had a lower endoscopy (such as a colonoscopy or flexible sigmoidoscopy) you may notice spotting of blood in your stool or on the toilet paper. If you underwent a bowel prep for your procedure, you may not have a normal bowel movement for a few days.  Please Note:  You might notice some irritation and congestion in your nose or some drainage.  This is from the oxygen used during your procedure.  There is no need for concern and it should clear up in a day or so.  SYMPTOMS TO REPORT IMMEDIATELY:   Following lower endoscopy (colonoscopy or flexible sigmoidoscopy):  Excessive amounts of blood in the stool  Significant tenderness or worsening of abdominal pains  Swelling of the abdomen that is new, acute  Fever of 100F or higher  For urgent or emergent issues, a gastroenterologist can be reached at any hour by calling [phone].   DIET:  We do recommend a small meal at first, but then you may proceed to your regular diet.  Drink plenty of fluids but you should avoid alcoholic beverages for 24 hours.  ACTIVITY:  You should plan to take it easy for the rest of today and you should NOT DRIVE or use heavy machinery until tomorrow (because of the sedation  medicines used during the test).    FOLLOW UP: Our staff will call the number listed on your records the next business day following your procedure to check on you and address any questions or concerns that you may have regarding the information given to you following your procedure. If we do not reach you, we will leave a message.  However, if you are feeling well and you are not experiencing any problems, there is no need to return our call.  We will assume that you have returned to your regular daily activities without incident.  If any biopsies were taken you will be contacted by phone or by letter within the next 1-3 weeks.  Please call us at [phone] if you have not heard about the biopsies in 3 weeks.    SIGNATURES/CONFIDENTIALITY: You and/or your care partner have signed paperwork which will be entered into your electronic medical record.  These signatures attest to the fact that that the information above on your After Visit Summary has been reviewed and is understood.  Full responsibility of the confidentiality of this discharge information lies with you and/or your care-partner.

## 2018-08-17 NOTE — Progress Notes (Signed)
Report given to PACU, vss 

## 2018-08-18 ENCOUNTER — Telehealth: Payer: Self-pay

## 2018-08-18 NOTE — Telephone Encounter (Signed)
  Follow up Call-  Call back number 08/17/2018  Post procedure Call Back phone  # [phone]  Permission to leave phone message Yes  Some recent data might be hidden     Patient questions:  Do you have a fever, pain , or abdominal swelling? No. Pain Score  0 *  Have you tolerated food without any problems? Yes.    Have you been able to return to your normal activities? Yes.    Do you have any questions about your discharge instructions: Diet   No. Medications  No. Follow up visit  No.  Do you have questions or concerns about your Care? No.  Actions: * If pain score is 4 or above: No action needed, pain <4.

## 2018-08-18 NOTE — Telephone Encounter (Signed)
NO ANSWER, MESSAGE LEFT FOR PATIENT. 

## 2018-08-23 ENCOUNTER — Encounter: Payer: Self-pay | Admitting: Gastroenterology

## 2018-10-16 ENCOUNTER — Ambulatory Visit: Payer: BLUE CROSS/BLUE SHIELD | Admitting: Allergy and Immunology

## 2018-10-16 DIAGNOSIS — J301 Allergic rhinitis due to pollen: Secondary | ICD-10-CM

## 2018-12-12 ENCOUNTER — Other Ambulatory Visit: Payer: Self-pay | Admitting: Neurology

## 2018-12-13 NOTE — Progress Notes (Deleted)
NEUROLOGY FOLLOW UP OFFICE NOTE  Alexa Horn [id number]  HISTORY OF PRESENT ILLNESS: Alexa Horn is a 38 year old right-handed woman with ADHD, chronic pancreatitis and depression who follows up for migraines.  UPDATE: Intensity:  *** Duration:  *** Frequency:  *** Frequency of abortive medication: *** Current NSAIDS:  none Current analgesics:   None Current triptans:   Maxalt 10 mg Current ergotamine:  none Current anti-emetic:  Promethazine 25mg  Current muscle relaxants:  Flexeril Current anti-anxiolytic:  none Current sleep aide:  none Current Antihypertensive medications:   Propranolol ER 60 mg daily Current Antidepressant medications:  Sertraline 100mg  daily Current Anticonvulsant medications:  None Current anti-CGRP:  none Current Vitamins/Herbal/Supplements:  none Current Antihistamines/Decongestants:  Benadryl Other therapy:  daith piercings Other medication:  none  Caffeine:  1 cup of coffee 3 times a week  Diet:  1 caffeine drink a day, 1-3 bottles of water minimum.  Limited soda Exercise:  Limited due to ankle surgery Depression: yes ; Anxiety:  sometimes Other pain:  ankle Sleep hygiene:  poor  HISTORY: Onset:  teenager Location:  Right frontal/retro-orbital, right occipital, left occipital to frontal Quality:  pounding Initial intensity:  10/10.  She denies new headache, thunderclap headache or severe headache that wakes her from sleep. Aura:  Sees "bubbles" and squiggly lines and sparkles Prodrome:  none Postdrome:  none Associated symptoms: Photophobia, phonophobia, nausea, vomiting.  She denies associated autonomic symptoms or unilateral numbness or weakness. Initial duration:  Ongoing unless treated Initial Frequency:  20 days of headache a month Initial Frequency of abortive medication: almost Excedrin daily Triggers: Nighttime driving/glare, flashing lights, staring at computer or TV screen, prolonged driving, loud music Exacerbating factors:   Light, noise, movement Relieving factors: Sometimes caffeine Activity:  aggravates  If she needs to go to the ED, she gets a headache cocktail is Phenergan, Ativan and Dilaudid.  Most recently, she was seen in the ED on 07/17/18 for right jaw pain.  It started after a dental cleaning 7 months ago.  Her dentist at the time suggested it may be TMJ dysfunction but did not evaluate further.  Past NSAIDS:  Ibuprofen, naproxen Past analgesics:   Excedrin, Tylenol, tramadol (does not remember if effective) Past abortive triptans:  Relpax 20mg , sumatriptan 50mg  Past abortive ergotamine:  none Past muscle relaxants:  none Past anti-emetic:  Zofran ODT 4mg  (ineffective) Past antihypertensive medications:  none Past antidepressant medications:  none Past anticonvulsant medications:  topiramate 50mg  twice daily Past anti-CGRP:  none Past vitamins/Herbal/Supplements:  none Past antihistamines/decongestants:  none Other past therapies:  none  Family history of headache:  Mom, grandmother, aunts  MRI of brain without contrast from 03/11/17 personally reviewed and was normal. CT Head without contrast from 10/11/17 personally reviewed was normal.  PAST MEDICAL HISTORY: Past Medical History:  Diagnosis Date  . ADHD (attention deficit hyperactivity disorder)   . Allergy    seasonal  . Chronic headaches   . Depression   . GERD (gastroesophageal reflux disease)   . History of migraine headaches   . Infectious colitis 05/2018  . Pancreatitis 2004   Idiopathic? dx when she was pregnant, she gets flareups when she eats tomato and decongestant, [place]  . Urticaria     MEDICATIONS: Current Outpatient Medications on File Prior to Visit  Medication Sig Dispense Refill  . aspirin-acetaminophen-caffeine (EXCEDRIN MIGRAINE) 250-250-65 MG tablet Take 2 tablets by mouth every 6 (six) hours as needed for headache.    . cyclobenzaprine (  FLEXERIL) 10 MG tablet Take 1 tablet (10  mg total) by mouth 2 (two) times daily as needed for muscle spasms. 20 tablet 0  . Diclofenac Sodium 1 % CREA Place onto the skin as needed.    . dicyclomine (BENTYL) 10 MG capsule TAKE 1 CAPSULE THREE TIMES DAILY  0  . diphenhydrAMINE (BENADRYL) 25 mg capsule Take 1 capsule (25 mg total) by mouth every 6 (six) hours as needed. 30 capsule 0  . EPINEPHrine (EPIPEN 2-PAK) 0.3 mg/0.3 mL IJ SOAJ injection Inject 0.3 mLs (0.3 mg total) into the skin as needed. 2 Device 1  . hyoscyamine (ANASPAZ) 0.125 MG TBDP disintergrating tablet Place 0.125 mg under the tongue every 4 (four) hours as needed.    . loratadine (CLARITIN) 10 MG tablet Take 10 mg by mouth daily.    . phentermine (ADIPEX-P) 37.5 MG tablet Take 37.5 mg by mouth daily.   0  . promethazine (PHENERGAN) 25 MG tablet Take 25 mg by mouth every 6 (six) hours as needed for nausea or vomiting.    . propranolol ER (INDERAL LA) 60 MG 24 hr capsule TAKE 1 CAPSULE BY MOUTH EVERY DAY 90 capsule 1  . rizatriptan (MAXALT-MLT) 10 MG disintegrating tablet Take 1 tablet earliest onset of migraine.  May repeat x1 in 2 hours if needed.  Do not exceed 2 tablets in 24h 9 tablet 3  . sertraline (ZOLOFT) 100 MG tablet Take 100 mg by mouth daily.  3   No current facility-administered medications on file prior to visit.     ALLERGIES: Allergies  Allergen Reactions  . Ceftin [Cefuroxime Axetil] Hives  . Hydrocodone-Acetaminophen Itching  . Lactose Other (See Comments)  . Orange Oil Other (See Comments)  . Percocet [Oxycodone-Acetaminophen] Itching  . Tape Hives    Clear tape causes reaction but paper tape is fine  . Bioflavonoids Other (See Comments)    Advised caused yeast infections when she was younger  . Lactose Intolerance (Gi)   . Orange Fruit [Citrus]     Advised caused yeast infections when she was younger    FAMILY HISTORY: Family History  Problem Relation Age of Onset  . Breast cancer Maternal Aunt   . Breast cancer Maternal Grandmother     . Ovarian cancer Maternal Grandmother   . Heart disease Maternal Grandfather   . Hypercholesterolemia Maternal Grandfather   . Hypertension Maternal Grandfather   . Kidney disease Maternal Uncle    ***.  SOCIAL HISTORY: Social History   Socioeconomic History  . Marital status: Married    Spouse name: [place]  . Number of children: 2  . Years of education: 55  . Highest education level: Some college, no degree  Occupational History  . 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    Social Needs  . Financial resource strain: Not on file  . Food insecurity:    Worry: Not on file    Inability: Not on file  . Transportation needs:    Medical: Not on file    Non-medical: Not on file  Tobacco Use  . Smoking status: Never Smoker  . Smokeless tobacco: Never Used  Substance and Sexual Activity  . Alcohol use: Yes    Comment: couple of glasses of wine or beer less than every week.    . Drug use: No  . Sexual activity: Yes    Birth control/protection: Surgical    Comment: s/p tubal ligation  Lifestyle  . Physical activity:  Days per week: Not on file    Minutes per session: Not on file  . Stress: Not on file  Relationships  . Social connections:    Talks on phone: Not on file    Gets together: Not on file    Attends religious service: Not on file    Active member of club or organization: Not on file    Attends meetings of clubs or organizations: Not on file    Relationship status: Not on file  . Intimate partner violence:    Fear of current or ex partner: Not on file    Emotionally abused: Not on file    Physically abused: Not on file    Forced sexual activity: Not on file  Other Topics Concern  . Not on file  Social History Narrative   Patient is right-handed. She lives with her husband in a one story home. She drinks 1 cup of coffee, and 2 glasses of tea a day. She does not exercise. She was adopted by her biological sister. She has very little  communication with her biological mother and none with her father.    REVIEW OF SYSTEMS: Constitutional: No fevers, chills, or sweats, no generalized fatigue, change in appetite Eyes: No visual changes, double vision, eye pain Ear, nose and throat: No hearing loss, ear pain, nasal congestion, sore throat Cardiovascular: No chest pain, palpitations Respiratory:  No shortness of breath at rest or with exertion, wheezes GastrointestinaI: No nausea, vomiting, diarrhea, abdominal pain, fecal incontinence Genitourinary:  No dysuria, urinary retention or frequency Musculoskeletal:  No neck pain, back pain Integumentary: No rash, pruritus, skin lesions Neurological: as above Psychiatric: No depression, insomnia, anxiety Endocrine: No palpitations, fatigue, diaphoresis, mood swings, change in appetite, change in weight, increased thirst Hematologic/Lymphatic:  No purpura, petechiae. Allergic/Immunologic: no itchy/runny eyes, nasal congestion, recent allergic reactions, rashes  PHYSICAL EXAM: *** General: No acute distress.  Patient appears ***-groomed.  *** body habitus. Head:  Normocephalic/atraumatic Eyes:  Fundi examined but not visualized Neck: supple, no paraspinal tenderness, full range of motion Heart:  Regular rate and rhythm Lungs:  Clear to auscultation bilaterally Back: No paraspinal tenderness Neurological Exam: alert and oriented to person, place, and time. Attention span and concentration intact, recent and remote memory intact, fund of knowledge intact.  Speech fluent and not dysarthric, language intact.  CN II-XII intact. Bulk and tone normal, muscle strength 5/5 throughout.  Sensation to light touch, temperature and vibration intact.  Deep tendon reflexes 2+ throughout, toes downgoing.  Finger to nose and heel to shin testing intact.  Gait normal, Romberg negative.  IMPRESSION: ***  PLAN: ***  Shon MilletAdam Jaffe, DO  CC: ***

## 2018-12-15 ENCOUNTER — Ambulatory Visit: Payer: BLUE CROSS/BLUE SHIELD | Admitting: Neurology

## 2019-06-03 ENCOUNTER — Emergency Department (HOSPITAL_COMMUNITY)
Admission: EM | Admit: 2019-06-03 | Discharge: 2019-06-03 | Disposition: A | Discharge status: Home / Self Care | Payer: Medicaid Other | Attending: Emergency Medicine | Admitting: Emergency Medicine

## 2019-06-03 ENCOUNTER — Other Ambulatory Visit: Payer: Self-pay

## 2019-06-03 ENCOUNTER — Encounter (HOSPITAL_COMMUNITY): Payer: Self-pay | Admitting: Emergency Medicine

## 2019-06-03 DIAGNOSIS — Z79899 Other long term (current) drug therapy: Secondary | ICD-10-CM | POA: Insufficient documentation

## 2019-06-03 DIAGNOSIS — T7840XA Allergy, unspecified, initial encounter: Secondary | ICD-10-CM

## 2019-06-03 DIAGNOSIS — Z91040 Latex allergy status: Secondary | ICD-10-CM | POA: Insufficient documentation

## 2019-06-03 DIAGNOSIS — L509 Urticaria, unspecified: Secondary | ICD-10-CM

## 2019-06-03 MED ORDER — EPINEPHRINE 0.3 MG/0.3ML IJ SOAJ: 0.3000 mg | INTRAMUSCULAR | 0 refills | Status: DC | PRN

## 2019-06-03 MED ORDER — DEXAMETHASONE 4 MG PO TABS
4.0000 mg | ORAL_TABLET | Freq: Once | ORAL | Status: AC
  Administered 2019-06-03: 4 mg via ORAL
  Filled 2019-06-03: qty 1

## 2019-06-03 MED ORDER — DIPHENHYDRAMINE HCL 25 MG PO CAPS
50.0000 mg | ORAL_CAPSULE | Freq: Once | ORAL | Status: AC
  Administered 2019-06-03: 50 mg via ORAL
  Filled 2019-06-03: qty 2

## 2019-06-03 NOTE — ED Triage Notes (Signed)
Onset last 3 days developed hives on the torso of her body and is very itchy. States taking benadryl with no relief. Has not changed soaps or ate anything new. Stated ate candy corn and then developed these symptoms however eats candy corn plenty all the time. Airway intact bilateral equal chest rise and fall. When swallowing at times it feels "thick". Has an epi pen however has not used it for a long time and since then it is expired.

## 2019-06-03 NOTE — ED Notes (Signed)
Pt itching all over her body for 3-4 days no diffioculty breathing she has been taking benadryl that has not helped

## 2019-06-03 NOTE — ED Provider Notes (Signed)
[facility] EMERGENCY DEPARTMENT Provider Note   CSN: [id number] Arrival date & time: 06/03/19  1856    History   Chief Complaint Chief Complaint  Patient presents with  . Allergic Reaction    HPI Alexa Horn is a 38 y.o. female.     HPI Patient presents from home for evaluation of rash.  She reports that onset was yesterday evening.  It was in multiple places, very itchy, is migratory.  She has pictures that show urticarial clusters.  Does not exhibit proclivity for intertriginous areas.  No known contact with allergens.  Has tried Benadryl several times with minimal relief.  Has not been worsening today but has been persistent.  Past Medical History:  Diagnosis Date  . ADHD (attention deficit hyperactivity disorder)   . Allergy    seasonal  . Chronic headaches   . Depression   . GERD (gastroesophageal reflux disease)   . History of migraine headaches   . Infectious colitis 05/2018  . Pancreatitis 2004   Idiopathic? dx when she was pregnant, she gets flareups when she eats tomato and decongestant, was seen by Dr Lyndel Safe in [place]  . Urticaria     Patient Active Problem List   Diagnosis Date Noted  . Colitis 05/07/2018    Past Surgical History:  Procedure Laterality Date  . ADENOIDECTOMY    . TONSILLECTOMY AND ADENOIDECTOMY  ~ 1996  . TUBAL LIGATION    . UMBILICAL HERNIA REPAIR  ~ 2004  . UPPER GASTROINTESTINAL ENDOSCOPY     between 2004 and 2010.  Dr Lyndel Safe.  No records found in [place].     OB History   No obstetric history on file.      Home Medications    Prior to Admission medications   Medication Sig Start Date End Date Taking? Authorizing Provider  aspirin-acetaminophen-caffeine (EXCEDRIN MIGRAINE) [phone] MG tablet Take 2 tablets by mouth every 6 (six) hours as needed for headache.    [provider]  cyclobenzaprine (FLEXERIL) 10 MG tablet Take 1 tablet (10 mg total) by mouth 2 (two) times daily  as needed for muscle spasms. 07/17/18   Tacy Learn, PA-C  Diclofenac Sodium 1 % CREA Place onto the skin as needed.    [provider]  dicyclomine (BENTYL) 10 MG capsule TAKE 1 CAPSULE THREE TIMES DAILY 07/28/18   [provider]  diphenhydrAMINE (BENADRYL) 25 mg capsule Take 1 capsule (25 mg total) by mouth every 6 (six) hours as needed. 03/09/17   Shary Decamp, PA-C  EPINEPHrine (EPIPEN 2-PAK) 0.3 mg/0.3 mL IJ SOAJ injection Inject 0.3 mLs (0.3 mg total) into the skin as needed. 06/03/19   Tillie Fantasia, MD  hyoscyamine (ANASPAZ) 0.125 MG TBDP disintergrating tablet Place 0.125 mg under the tongue every 4 (four) hours as needed.    [provider]  loratadine (CLARITIN) 10 MG tablet Take 10 mg by mouth daily.    [provider]  phentermine (ADIPEX-P) 37.5 MG tablet Take 37.5 mg by mouth daily.  09/26/17   [provider]  promethazine (PHENERGAN) 25 MG tablet Take 25 mg by mouth every 6 (six) hours as needed for nausea or vomiting.    [provider]  propranolol ER (INDERAL LA) 60 MG 24 hr capsule TAKE 1 CAPSULE BY MOUTH EVERY DAY 12/12/18   Tomi Likens, Adam R, DO  rizatriptan (MAXALT-MLT) 10 MG disintegrating tablet Take 1 tablet earliest onset of migraine.  May repeat x1 in 2 hours  if needed.  Do not exceed 2 tablets in 24h 08/15/18   Everlena CooperJaffe, Adam R, DO  sertraline (ZOLOFT) 100 MG tablet Take 100 mg by mouth daily. 04/26/18   [provider]    Family History Family History  Problem Relation Age of Onset  . Breast cancer Maternal Aunt   . Breast cancer Maternal Grandmother   . Ovarian cancer Maternal Grandmother   . Heart disease Maternal Grandfather   . Hypercholesterolemia Maternal Grandfather   . Hypertension Maternal Grandfather   . Kidney disease Maternal Uncle     Social History Social History   Tobacco Use  . Smoking status: Never Smoker  . Smokeless tobacco: Never Used  Substance Use Topics  . Alcohol use: Yes     Comment: couple of glasses of wine or beer less than every week.    . Drug use: No     Allergies   Ceftin [cefuroxime axetil], Hydrocodone-acetaminophen, Lactose, Latex, Orange oil, Percocet [oxycodone-acetaminophen], Tape, Bioflavonoids, Lactose intolerance (gi), and Orange fruit [citrus]   Review of Systems Review of Systems  Skin: Positive for rash.  All other systems reviewed and are negative.    Physical Exam Updated Vital Signs BP (!) 131/95   Pulse 82   Temp 98.3 F (36.8 C) (Oral)   Resp 15   Ht 5\' 1"  (1.549 m)   Wt 100 kg   SpO2 98%   BMI 41.66 kg/m   Physical Exam Vitals signs and nursing note reviewed.  Constitutional:      General: She is not in acute distress.    Appearance: She is well-developed.  HENT:     Head: Normocephalic and atraumatic.  Eyes:     Conjunctiva/sclera: Conjunctivae normal.  Neck:     Musculoskeletal: Neck supple.  Cardiovascular:     Rate and Rhythm: Normal rate and regular rhythm.     Heart sounds: No murmur.  Pulmonary:     Effort: Pulmonary effort is normal. No respiratory distress.     Breath sounds: Normal breath sounds.  Abdominal:     Palpations: Abdomen is soft.     Tenderness: There is no abdominal tenderness.  Skin:    General: Skin is warm and dry.     Findings: Rash present.     Comments: Multiple urticarial lesions.  Sporadic.  Not coalescent.  Noted especially on the trunk and upper extremities.  Neurological:     Mental Status: She is alert.      ED Treatments / Results  Labs (all labs ordered are listed, but only abnormal results are displayed) Labs Reviewed - No data to display  EKG None  Radiology No results found.  Procedures Procedures (including critical care time)  Medications Ordered in ED Medications  diphenhydrAMINE (BENADRYL) capsule 50 mg (has no administration in time range)  dexamethasone (DECADRON) tablet 4 mg (has no administration in time range)     Initial Impression /  Assessment and Plan / ED Course  I have reviewed the triage vital signs and the nursing notes.  Pertinent labs & imaging results that were available during my care of the patient were reviewed by me and considered in my medical decision making (see chart for details).        Ms. Alexa Horn is a 38 year old female with no chronic medical problems but she does have multiple allergies.  She presents today for cutaneous allergic reaction symptoms.  Her exam is reassuring, as is her history.  She has not had any airway involvement  or GI symptoms.  Overall I have low concern for anaphylaxis.  She seems to possibly be improving.  Certainly does not seem worsening.  Gave Benadryl here with single dose of Decadron to provide symptomatic relief for the night.  Expect will have improvement tomorrow.  Encouraged her to keep a journal of exposure to potential allergens and follow-up with PCP as needed.  Also refilled prescription for EpiPen in the event of worsening reaction.  Patient vocalized understanding and agreement with plan. Hand no other questions or concerns. Was given relevant verbal and written information regarding aftercare and return precautions and discharged in good condition.    Final Clinical Impressions(s) / ED Diagnoses   Final diagnoses:  Allergic reaction, initial encounter  Urticaria    ED Discharge Orders         Ordered    EPINEPHrine (EPIPEN 2-PAK) 0.3 mg/0.3 mL IJ SOAJ injection  As needed    Note to Pharmacy: [employer] brand/generic only.   06/03/19 2147           Jaclynn MajorStarnes, Awais Cobarrubias, MD 06/03/19 [id number]    Pricilla LovelessGoldston, Scott, MD 06/03/19 2250

## 2019-06-04 ENCOUNTER — Encounter: Payer: Self-pay | Admitting: Allergy and Immunology

## 2019-06-04 ENCOUNTER — Ambulatory Visit (INDEPENDENT_AMBULATORY_CARE_PROVIDER_SITE_OTHER): Payer: Self-pay | Admitting: Allergy and Immunology

## 2019-06-04 VITALS — BP 148/94 | HR 130 | Resp 21 | Ht 61.5 in | Wt 215.6 lb

## 2019-06-04 DIAGNOSIS — L500 Allergic urticaria: Secondary | ICD-10-CM

## 2019-06-04 DIAGNOSIS — T7800XD Anaphylactic reaction due to unspecified food, subsequent encounter: Secondary | ICD-10-CM

## 2019-06-04 DIAGNOSIS — J3089 Other allergic rhinitis: Secondary | ICD-10-CM

## 2019-06-04 MED ORDER — MONTELUKAST SODIUM 10 MG PO TABS: 10.0000 mg | ORAL_TABLET | Freq: Every day | ORAL | 5 refills | Status: AC

## 2019-06-04 MED ORDER — PREDNISONE 10 MG PO TABS: ORAL_TABLET | ORAL | 0 refills | Status: AC

## 2019-06-04 MED ORDER — EPINEPHRINE 0.3 MG/0.3ML IJ SOAJ: 0.3000 mg | INTRAMUSCULAR | 3 refills | Status: AC | PRN

## 2019-06-04 MED ORDER — FAMOTIDINE 20 MG PO TABS: ORAL_TABLET | ORAL | 5 refills | Status: AC

## 2019-06-04 NOTE — Progress Notes (Signed)
[place] - [place]   Follow-up Note  Referring Provider: Eunice Blase'Buch, Greta, PA-C Primary Provider: Eunice Blase'Buch, Greta, PA-C Date of Office Visit: 06/04/2019  Subjective:   Alexa Horn (DOB: 03/30/1981) is a 38 y.o. female who [facility] in re-evaluation of the following:  HPI: Alexa Horn in evaluation of alpha gal syndrome and allergic rhinitis..  I have not seen her in this [place] since 12 October 2017.  Alexa Horn started introducing mammal to her diet around 2018 and she did quite well while not performing any type of avoidance measures and had no evidence of hypersensitivity directed against any food substance.  However, 3 days ago she developed global urticaria associated with some nausea and she went to the emergency room last night and received Decadron.  She has no associated systemic or constitutional symptoms other than her nausea and there is no obvious trigger giving rise to this issue.  She has not started a new hobby or some type of new environmental exposure at home or work or started any type of new over-the-counter medication or prescription medication and she has a tubal ligation and is not pregnant.  She continues to eat mammal and in fact had bacon today.    Regarding her allergic rhinitis, she does use a nasal steroid and an antihistamine especially during the springtime season of the year which works successfully.  Allergies as of 06/04/2019      Reactions   Ceftin [cefuroxime Axetil] Hives   Hydrocodone-acetaminophen Itching   Lactose Other (See Comments)   Latex    Orange Oil Other (See Comments)   Percocet [oxycodone-acetaminophen] Itching   Tape Hives   Clear tape causes reaction but paper tape is fine   Bioflavonoids Other (See Comments)   Advised caused yeast infections when she was younger   Lactose Intolerance (gi)    Orange Fruit [citrus]    Advised caused yeast  infections when she was younger      Medication List    aspirin-acetaminophen-caffeine 250-250-65 MG tablet Commonly known as: EXCEDRIN MIGRAINE Take 2 tablets by mouth every 6 (six) hours as needed for headache.   diphenhydrAMINE 25 mg capsule Commonly known as: BENADRYL Take 1 capsule (25 mg total) by mouth every 6 (six) hours as needed.   EPINEPHrine 0.3 mg/0.3 mL Soaj injection Commonly known as: EpiPen 2-Pak Inject 0.3 mLs (0.3 mg total) into the skin as needed.   loratadine 10 MG tablet Commonly known as: CLARITIN Take 10 mg by mouth daily.   phentermine 37.5 MG tablet Commonly known as: ADIPEX-P Take 37.5 mg by mouth daily.       Past Medical History:  Diagnosis Date  . ADHD (attention deficit hyperactivity disorder)   . Allergy    seasonal  . Chronic headaches   . Depression   . GERD (gastroesophageal reflux disease)   . History of migraine headaches   . Infectious colitis 05/2018  . Pancreatitis 2004   Idiopathic? dx when she was pregnant, she gets flareups when she eats tomato and decongestant, was seen by Dr Chales AbrahamsGupta in [place]  . Urticaria     Past Surgical History:  Procedure Laterality Date  . ADENOIDECTOMY    . TONSILLECTOMY AND ADENOIDECTOMY  ~ 1996  . TUBAL LIGATION    . UMBILICAL HERNIA REPAIR  ~ 2004  . UPPER GASTROINTESTINAL ENDOSCOPY     between 2004 and 2010.  Dr Chales AbrahamsGupta.  No records found in [place].    Review of systems negative except as noted in HPI / PMHx or noted below:  Review of Systems  Constitutional: Negative.   HENT: Negative.   Eyes: Negative.   Respiratory: Negative.   Cardiovascular: Negative.   Gastrointestinal: Negative.   Genitourinary: Negative.   Musculoskeletal: Negative.   Skin: Negative.   Neurological: Negative.   Endo/Heme/Allergies: Negative.   Psychiatric/Behavioral: Negative.      Objective:   Vitals:   06/04/19 1533  BP: (!) 148/94  Pulse: (!) 130  Resp: (!) 21  SpO2: 97%    Height: 5' 1.5" (156.2 cm)  Weight: 215 lb 9.6 oz (97.8 kg)   Physical Exam Constitutional:      Appearance: She is not diaphoretic.  HENT:     Head: Normocephalic.     Right Ear: Tympanic membrane, ear canal and external ear normal.     Left Ear: Tympanic membrane, ear canal and external ear normal.     Nose: Nose normal. No mucosal edema or rhinorrhea.     Mouth/Throat:     Pharynx: Uvula midline. No oropharyngeal exudate.  Eyes:     Conjunctiva/sclera: Conjunctivae normal.  Neck:     Thyroid: No thyromegaly.     Trachea: Trachea normal. No tracheal tenderness or tracheal deviation.  Cardiovascular:     Rate and Rhythm: Normal rate and regular rhythm.     Heart sounds: Normal heart sounds, S1 normal and S2 normal. No murmur.  Pulmonary:     Effort: No respiratory distress.     Breath sounds: Normal breath sounds. No stridor. No wheezing or rales.  Lymphadenopathy:     Head:     Right side of head: No tonsillar adenopathy.     Left side of head: No tonsillar adenopathy.     Cervical: No cervical adenopathy.  Skin:    Findings: No erythema or rash (Multiple blanching urticarial lesions across extremities and trunk and face.).     Nails: There is no clubbing.   Neurological:     Mental Status: She is alert.     Diagnostics: none  Assessment and Plan:   1. Anaphylactic shock due to food, subsequent encounter   2. Allergic urticaria   3. Other allergic rhinitis     1. Stop consuming mammal  2. EpiPen / Auvi-Q 0.3, Benadryl, M.D./ER evaluation for allergic reaction.   3. Use the following every day:   A. Cetirizine 10 mg - 1-2 tablet 1-2 times per day (MAX = 40 mg)  B. Montelukast 10 mg - 1 tablet 1 time per day  C. Famotidine 20 mg - 1 tablet 1 time per day  D. Prednisone 10 mg - 1 tablet 1 time per day for 10 days only  4. Can add OTC benadryl if needed.   5. Use OTC Nasacort / Flonase 1 spray each nostril one time per day during seasonal flareups  6.  Further evaluation and treatment?  [name] has some form of immunological hyperreactivity giving rise to her global urticaria and I would like for her to utilize the plan of action noted above assuming that this could be alpha gal syndrome recurrence or some transient trigger giving rise to immunological hyperreactivity.  Of course, she will require further evaluation if the situation continues or she develops associated systemic or constitutional symptoms.  Her insurance plan does not allow her to see anyone outside of the [facility] medical system and I encouraged her to  follow-up with her primary care doctor in that medical center system and have them refer her to the allergy[facility]immunology department at [id number] should she continue to have problems with recurrent immunological hyperreactivity.  Laurette SchimkeEric Standley Bargo, MD Allergy / Immunology [facility]

## 2019-06-04 NOTE — Patient Instructions (Addendum)
  1.  Stop consuming mammal  2. EpiPen / Auvi-Q 0.3, Benadryl, M.D./ER evaluation for allergic reaction.   3. Use the following every day:   A. Cetirizine 10 mg - 1-2 tablet 1-2 times per day (MAX = 40 mg)  B. Montelukast 10 mg - 1 tablet 1 time per day  C. Famotidine 20 mg - 1 tablet 1 time per day  D. Prednisone 10 mg - 1 tablet 1 time per day for 10 days only  4. Can add OTC benadryl if needed.   5. Use OTC Nasacort / Flonase 1 spray each nostril one time per day during seasonal flareups  6. Further evaluation and treatment?

## 2019-06-05 ENCOUNTER — Encounter: Payer: Self-pay | Admitting: Allergy and Immunology

## 2019-09-26 ENCOUNTER — Other Ambulatory Visit: Payer: Self-pay | Admitting: Neurology

## 2020-11-16 ENCOUNTER — Other Ambulatory Visit: Payer: Self-pay

## 2020-11-16 ENCOUNTER — Emergency Department (HOSPITAL_COMMUNITY): Payer: Worker's Compensation

## 2020-11-16 ENCOUNTER — Encounter (HOSPITAL_COMMUNITY): Payer: Self-pay | Admitting: Emergency Medicine

## 2020-11-16 ENCOUNTER — Emergency Department (HOSPITAL_COMMUNITY)
Admission: EM | Admit: 2020-11-16 | Discharge: 2020-11-16 | Disposition: A | Discharge status: Home / Self Care | Payer: Worker's Compensation | Attending: Emergency Medicine | Admitting: Emergency Medicine

## 2020-11-16 DIAGNOSIS — Z5321 Procedure and treatment not carried out due to patient leaving prior to being seen by health care provider: Secondary | ICD-10-CM | POA: Diagnosis not present

## 2020-11-16 DIAGNOSIS — S60511A Abrasion of right hand, initial encounter: Secondary | ICD-10-CM | POA: Diagnosis not present

## 2020-11-16 DIAGNOSIS — Y990 Civilian activity done for income or pay: Secondary | ICD-10-CM | POA: Diagnosis not present

## 2020-11-16 DIAGNOSIS — Y9272 Chicken coop as the place of occurrence of the external cause: Secondary | ICD-10-CM | POA: Diagnosis not present

## 2020-11-16 DIAGNOSIS — W231XXA Caught, crushed, jammed, or pinched between stationary objects, initial encounter: Secondary | ICD-10-CM | POA: Insufficient documentation

## 2020-11-16 DIAGNOSIS — S6991XA Unspecified injury of right wrist, hand and finger(s), initial encounter: Secondary | ICD-10-CM | POA: Diagnosis present

## 2020-11-16 NOTE — ED Triage Notes (Signed)
Pt reports R hand pain and abrasions.  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

## 2020-11-16 NOTE — ED Notes (Signed)
Pt's husband came in to ask about the wait time and decided to take the pt to urgent care.
